# Patient Record
Sex: Female | Born: 1975 | Race: Black or African American | Hispanic: No | Marital: Married | Smoking: Never smoker
Health system: Southern US, Community
[De-identification: ages and names within clinical notes are randomized; demographics above are authoritative.]

## PROBLEM LIST (undated history)

## (undated) DIAGNOSIS — N946 Dysmenorrhea, unspecified: Secondary | ICD-10-CM

## (undated) DIAGNOSIS — E559 Vitamin D deficiency, unspecified: Secondary | ICD-10-CM

## (undated) DIAGNOSIS — O039 Complete or unspecified spontaneous abortion without complication: Secondary | ICD-10-CM

## (undated) DIAGNOSIS — N890 Mild vaginal dysplasia: Secondary | ICD-10-CM

## (undated) DIAGNOSIS — E785 Hyperlipidemia, unspecified: Secondary | ICD-10-CM

## (undated) DIAGNOSIS — B029 Zoster without complications: Secondary | ICD-10-CM

## (undated) DIAGNOSIS — D759 Disease of blood and blood-forming organs, unspecified: Secondary | ICD-10-CM

## (undated) DIAGNOSIS — B977 Papillomavirus as the cause of diseases classified elsewhere: Secondary | ICD-10-CM

## (undated) DIAGNOSIS — T8859XA Other complications of anesthesia, initial encounter: Secondary | ICD-10-CM

## (undated) DIAGNOSIS — T4145XA Adverse effect of unspecified anesthetic, initial encounter: Secondary | ICD-10-CM

## (undated) DIAGNOSIS — J45909 Unspecified asthma, uncomplicated: Secondary | ICD-10-CM

## (undated) DIAGNOSIS — D649 Anemia, unspecified: Secondary | ICD-10-CM

## (undated) HISTORY — DX: Anemia, unspecified: D64.9

## (undated) HISTORY — DX: Mild vaginal dysplasia: N89.0

## (undated) HISTORY — DX: Hyperlipidemia, unspecified: E78.5

## (undated) HISTORY — DX: Vitamin D deficiency, unspecified: E55.9

## (undated) HISTORY — DX: Zoster without complications: B02.9

## (undated) HISTORY — DX: Dysmenorrhea, unspecified: N94.6

## (undated) HISTORY — DX: Complete or unspecified spontaneous abortion without complication: O03.9

## (undated) HISTORY — PX: TONSILLECTOMY: SUR1361

## (undated) HISTORY — DX: Papillomavirus as the cause of diseases classified elsewhere: B97.7

---
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
## 1998-02-28 ENCOUNTER — Emergency Department (HOSPITAL_COMMUNITY): Admission: EM | Admit: 1998-02-28 | Discharge: 1998-02-28 | Payer: Self-pay | Admitting: Emergency Medicine

## 1998-05-28 ENCOUNTER — Emergency Department (HOSPITAL_COMMUNITY): Admission: EM | Admit: 1998-05-28 | Discharge: 1998-05-28 | Payer: Self-pay | Admitting: Emergency Medicine

## 1998-12-27 ENCOUNTER — Encounter: Admission: RE | Admit: 1998-12-27 | Discharge: 1999-01-24 | Payer: Self-pay | Admitting: *Deleted

## 1999-04-03 ENCOUNTER — Encounter: Payer: Self-pay | Admitting: Gynecology

## 1999-04-03 ENCOUNTER — Ambulatory Visit (HOSPITAL_COMMUNITY): Admission: RE | Admit: 1999-04-03 | Discharge: 1999-04-03 | Payer: Self-pay | Admitting: Gynecology

## 1999-06-28 ENCOUNTER — Ambulatory Visit (HOSPITAL_COMMUNITY): Admission: RE | Admit: 1999-06-28 | Discharge: 1999-06-28 | Payer: Self-pay | Admitting: Gynecology

## 1999-06-28 ENCOUNTER — Encounter: Payer: Self-pay | Admitting: Gynecology

## 2002-02-25 ENCOUNTER — Other Ambulatory Visit: Admission: RE | Admit: 2002-02-25 | Discharge: 2002-02-25 | Payer: Self-pay | Admitting: Gynecology

## 2002-09-13 ENCOUNTER — Other Ambulatory Visit: Admission: RE | Admit: 2002-09-13 | Discharge: 2002-09-13 | Payer: Self-pay | Admitting: Gynecology

## 2003-06-21 ENCOUNTER — Other Ambulatory Visit: Admission: RE | Admit: 2003-06-21 | Discharge: 2003-06-21 | Payer: Self-pay | Admitting: Gynecology

## 2005-01-16 ENCOUNTER — Other Ambulatory Visit: Admission: RE | Admit: 2005-01-16 | Discharge: 2005-01-16 | Payer: Self-pay | Admitting: Gynecology

## 2005-03-04 ENCOUNTER — Ambulatory Visit (HOSPITAL_COMMUNITY): Admission: RE | Admit: 2005-03-04 | Discharge: 2005-03-04 | Payer: Self-pay | Admitting: Gynecology

## 2006-01-29 ENCOUNTER — Other Ambulatory Visit: Admission: RE | Admit: 2006-01-29 | Discharge: 2006-01-29 | Payer: Self-pay | Admitting: Gynecology

## 2006-04-30 ENCOUNTER — Ambulatory Visit: Payer: Self-pay | Admitting: Gastroenterology

## 2006-05-22 ENCOUNTER — Ambulatory Visit (HOSPITAL_BASED_OUTPATIENT_CLINIC_OR_DEPARTMENT_OTHER): Admission: RE | Admit: 2006-05-22 | Discharge: 2006-05-22 | Payer: Self-pay | Admitting: Gynecology

## 2006-05-22 HISTORY — PX: OTHER SURGICAL HISTORY: SHX169

## 2006-06-16 ENCOUNTER — Ambulatory Visit: Payer: Self-pay | Admitting: Gastroenterology

## 2006-12-14 ENCOUNTER — Other Ambulatory Visit: Admission: RE | Admit: 2006-12-14 | Discharge: 2006-12-14 | Payer: Self-pay | Admitting: Gynecology

## 2007-07-29 HISTORY — PX: OTHER SURGICAL HISTORY: SHX169

## 2008-04-12 ENCOUNTER — Ambulatory Visit: Payer: Self-pay | Admitting: Gynecology

## 2008-07-25 ENCOUNTER — Emergency Department (HOSPITAL_COMMUNITY): Admission: EM | Admit: 2008-07-25 | Discharge: 2008-07-26 | Payer: Self-pay | Admitting: Emergency Medicine

## 2008-08-03 ENCOUNTER — Inpatient Hospital Stay (HOSPITAL_COMMUNITY): Admission: RE | Admit: 2008-08-03 | Discharge: 2008-08-05 | Payer: Self-pay | Admitting: Specialist

## 2009-05-09 ENCOUNTER — Emergency Department (HOSPITAL_COMMUNITY): Admission: EM | Admit: 2009-05-09 | Discharge: 2009-05-10 | Payer: Self-pay | Admitting: Emergency Medicine

## 2009-06-11 ENCOUNTER — Ambulatory Visit: Payer: Self-pay | Admitting: Gynecology

## 2009-06-11 ENCOUNTER — Encounter: Payer: Self-pay | Admitting: Gynecology

## 2009-06-11 ENCOUNTER — Other Ambulatory Visit: Admission: RE | Admit: 2009-06-11 | Discharge: 2009-06-11 | Payer: Self-pay | Admitting: Gynecology

## 2010-06-14 ENCOUNTER — Other Ambulatory Visit: Admission: RE | Admit: 2010-06-14 | Discharge: 2010-06-14 | Payer: Self-pay | Admitting: Gynecology

## 2010-06-14 ENCOUNTER — Ambulatory Visit: Payer: Self-pay | Admitting: Gynecology

## 2010-10-21 ENCOUNTER — Ambulatory Visit (INDEPENDENT_AMBULATORY_CARE_PROVIDER_SITE_OTHER): Payer: BC Managed Care – PPO | Admitting: Gynecology

## 2010-10-21 DIAGNOSIS — B373 Candidiasis of vulva and vagina: Secondary | ICD-10-CM

## 2010-10-21 DIAGNOSIS — N898 Other specified noninflammatory disorders of vagina: Secondary | ICD-10-CM

## 2010-10-31 LAB — URINALYSIS, ROUTINE W REFLEX MICROSCOPIC
Bilirubin Urine: NEGATIVE
Glucose, UA: NEGATIVE mg/dL
Hgb urine dipstick: NEGATIVE
Ketones, ur: NEGATIVE mg/dL
Nitrite: NEGATIVE
Protein, ur: NEGATIVE mg/dL
Specific Gravity, Urine: 1.009 (ref 1.005–1.030)
Urobilinogen, UA: 1 mg/dL (ref 0.0–1.0)
pH: 7 (ref 5.0–8.0)

## 2010-10-31 LAB — POCT I-STAT, CHEM 8
BUN: 6 mg/dL (ref 6–23)
Calcium, Ion: 1.13 mmol/L (ref 1.12–1.32)
Chloride: 103 mEq/L (ref 96–112)
Creatinine, Ser: 0.9 mg/dL (ref 0.4–1.2)
Glucose, Bld: 108 mg/dL — ABNORMAL HIGH (ref 70–99)
HCT: 37 % (ref 36.0–46.0)
Hemoglobin: 12.6 g/dL (ref 12.0–15.0)
Potassium: 3.4 mEq/L — ABNORMAL LOW (ref 3.5–5.1)
Sodium: 141 mEq/L (ref 135–145)
TCO2: 26 mmol/L (ref 0–100)

## 2010-10-31 LAB — DIFFERENTIAL
Basophils Absolute: 0 10*3/uL (ref 0.0–0.1)
Basophils Relative: 1 % (ref 0–1)
Eosinophils Absolute: 0 10*3/uL (ref 0.0–0.7)
Eosinophils Relative: 1 % (ref 0–5)
Lymphocytes Relative: 34 % (ref 12–46)
Lymphs Abs: 2 10*3/uL (ref 0.7–4.0)
Monocytes Absolute: 0.3 10*3/uL (ref 0.1–1.0)
Monocytes Relative: 6 % (ref 3–12)
Neutro Abs: 3.5 10*3/uL (ref 1.7–7.7)
Neutrophils Relative %: 59 % (ref 43–77)

## 2010-10-31 LAB — CBC
HCT: 34.7 % — ABNORMAL LOW (ref 36.0–46.0)
Hemoglobin: 12 g/dL (ref 12.0–15.0)
MCHC: 34.6 g/dL (ref 30.0–36.0)
MCV: 100.9 fL — ABNORMAL HIGH (ref 78.0–100.0)
Platelets: 228 10*3/uL (ref 150–400)
RBC: 3.44 MIL/uL — ABNORMAL LOW (ref 3.87–5.11)
RDW: 12.3 % (ref 11.5–15.5)
WBC: 5.9 10*3/uL (ref 4.0–10.5)

## 2010-10-31 LAB — POCT PREGNANCY, URINE: Preg Test, Ur: NEGATIVE

## 2010-11-11 LAB — BASIC METABOLIC PANEL
BUN: 6 mg/dL (ref 6–23)
CO2: 29 mEq/L (ref 19–32)
Calcium: 8.7 mg/dL (ref 8.4–10.5)
Chloride: 105 mEq/L (ref 96–112)
Creatinine, Ser: 0.66 mg/dL (ref 0.4–1.2)
GFR calc Af Amer: >60 mL/min (ref >60)
GFR calc non Af Amer: >60 mL/min (ref >60)
Glucose, Bld: 66 mg/dL — ABNORMAL LOW (ref 70–99)
Potassium: 3.1 mEq/L — ABNORMAL LOW (ref 3.5–5.1)
Sodium: 138 mEq/L (ref 135–145)

## 2010-11-11 LAB — CBC
HCT: 32.6 % — ABNORMAL LOW (ref 36.0–46.0)
Hemoglobin: 11 g/dL — ABNORMAL LOW (ref 12.0–15.0)
MCHC: 33.6 g/dL (ref 30.0–36.0)
MCV: 100.5 fL — ABNORMAL HIGH (ref 78.0–100.0)
Platelets: 185 10*3/uL (ref 150–400)
RBC: 3.25 MIL/uL — ABNORMAL LOW (ref 3.87–5.11)
RDW: 12.9 % (ref 11.5–15.5)
WBC: 5.4 10*3/uL (ref 4.0–10.5)

## 2010-11-11 LAB — PREGNANCY, URINE: Preg Test, Ur: NEGATIVE

## 2010-11-19 IMAGING — CR DG ANKLE COMPLETE 3+V*L*
3 series · 3 of 3 positions shown · non-contrast
Comparison: None

CLINICAL DATA: Fall, twisted left ankle roller skating

LEFT ANKLE COMPLETE - 3+ VIEW

[view not recorded (1 of 3)]
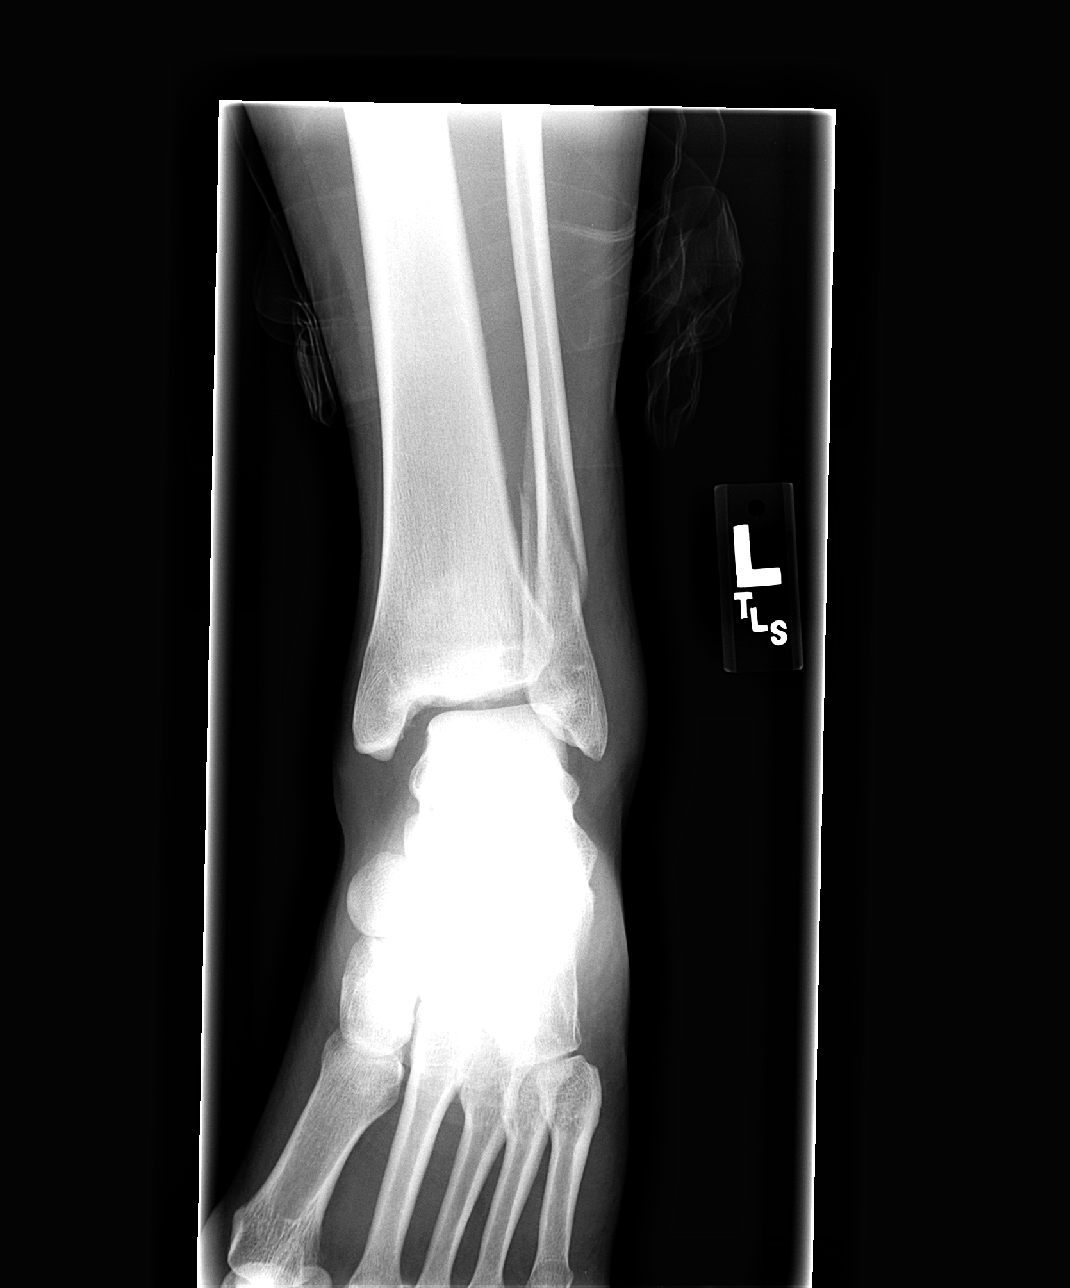

[view not recorded (2 of 3)]
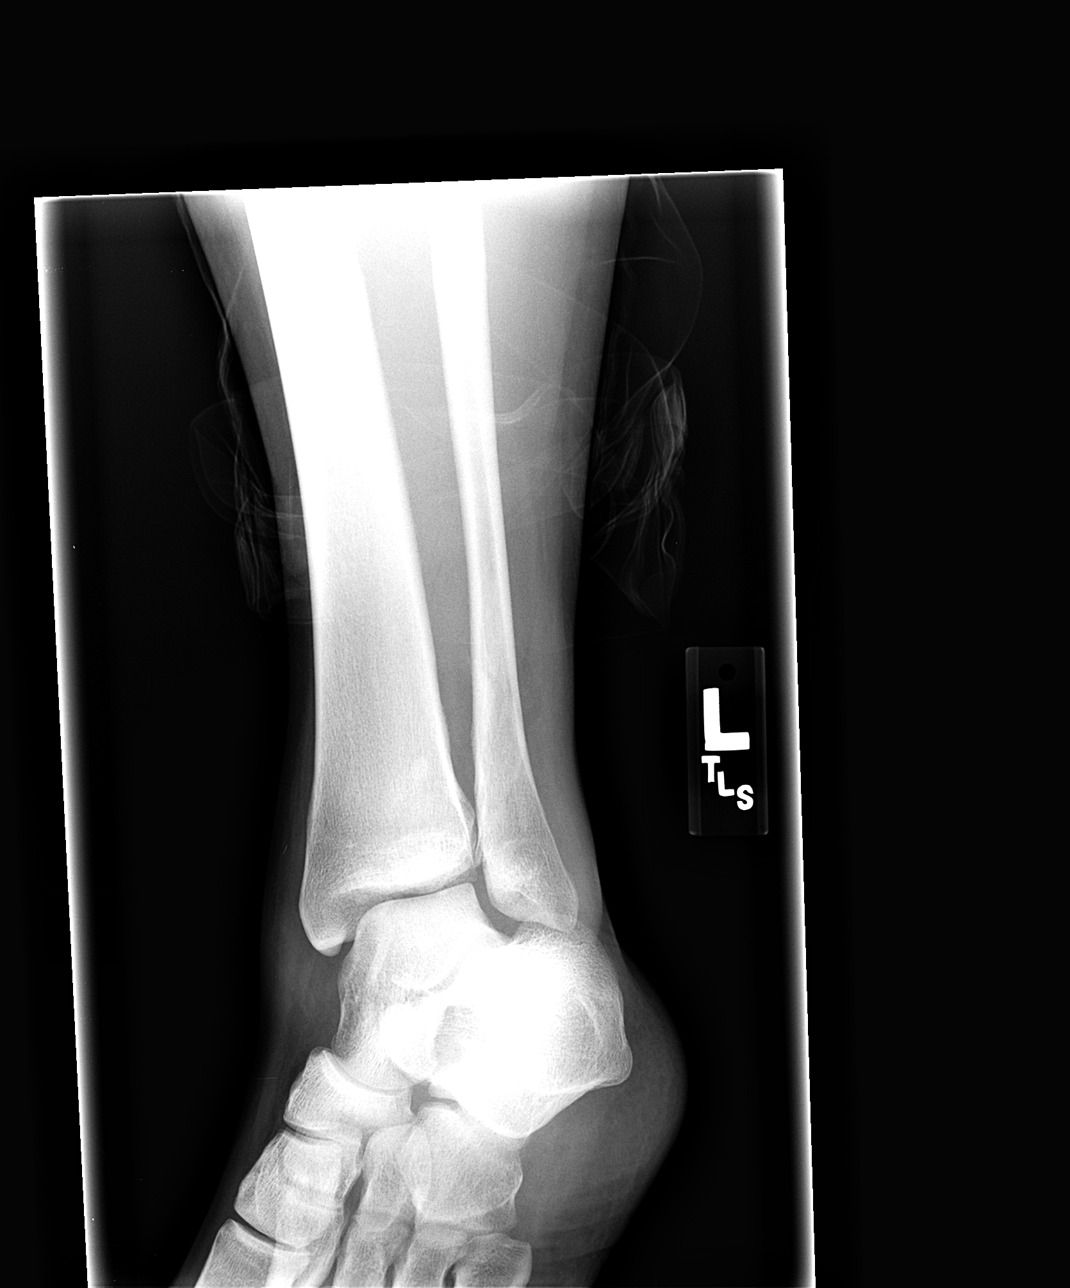

[view not recorded (3 of 3)]
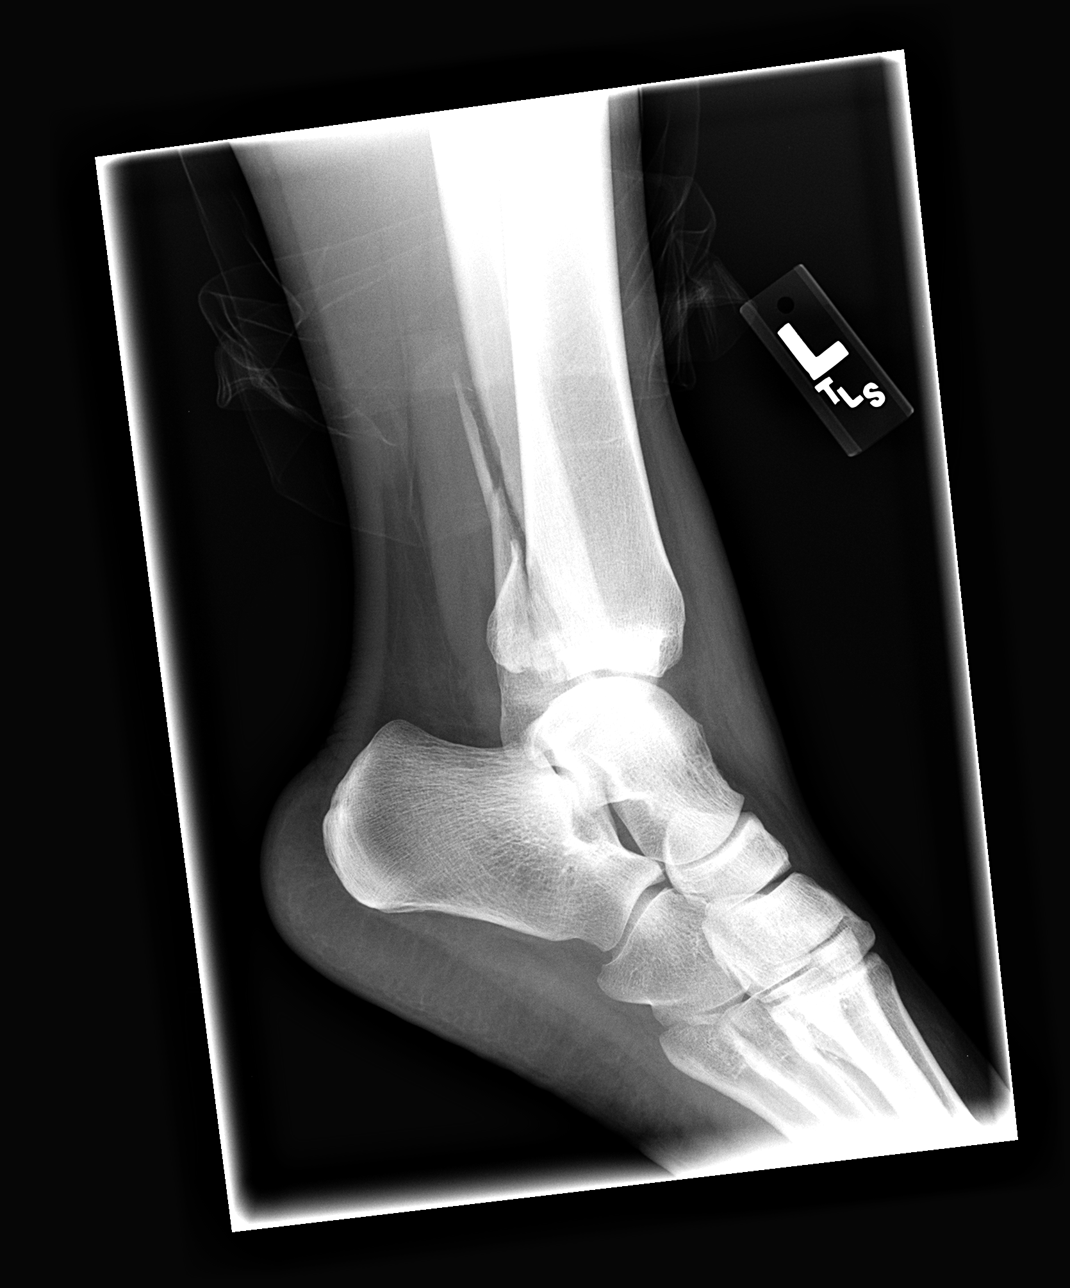

[3 of 3 positions shown; findings below may reference images not displayed]

FINDINGS: The ankle mortise is widened medially.  The talar dome
appears normal.  There is an oblique fracture through the distal
fibula.  There is a vertical fracture through the posterior
malleolus.
IMPRESSION: 1.  Oblique fracture through the distal fibula and vertical
fracture of the posterior malleolus of the tibia.
2.  Widened thinning of the medial ankle mortise suggests ligament
injury.

## 2010-11-29 ENCOUNTER — Ambulatory Visit (INDEPENDENT_AMBULATORY_CARE_PROVIDER_SITE_OTHER): Payer: BC Managed Care – PPO | Admitting: Gynecology

## 2010-11-29 DIAGNOSIS — Z30431 Encounter for routine checking of intrauterine contraceptive device: Secondary | ICD-10-CM

## 2010-11-29 DIAGNOSIS — N83209 Unspecified ovarian cyst, unspecified side: Secondary | ICD-10-CM

## 2010-11-29 DIAGNOSIS — Z803 Family history of malignant neoplasm of breast: Secondary | ICD-10-CM

## 2010-11-29 DIAGNOSIS — B373 Candidiasis of vulva and vagina: Secondary | ICD-10-CM

## 2010-11-29 DIAGNOSIS — N898 Other specified noninflammatory disorders of vagina: Secondary | ICD-10-CM

## 2010-12-10 NOTE — Op Note (Signed)
NAME:  Sabrina [name], Sabrina [name]                ACCOUNT NO.:  [id number]   MEDICAL RECORD NO.:  [id number]          PATIENT TYPE:  INP   LOCATION:  0008                         FACILITY[facility]  [facility]   PHYSICIAN:  Jene Every, M.D.    DATE OF BIRTH:  [birth year]   DATE OF PROCEDURE:  08/03/2008  DATE OF DISCHARGE:                               OPERATIVE REPORT   PREOPERATIVE DIAGNOSES:  1. Left bimalleolar ankle fracture.  2. Possible syndesmosis rupture.  3. Deltoid tear.   POSTOPERATIVE DIAGNOSES:  1. Left bimalleolar ankle fracture.  2. Possible syndesmosis rupture.  3. Deltoid tear.   PROCEDURES PERFORMED:  1. Open reduction and internal fixation, left fibula.  2  Open repair, deltoid ligament.  1. Stress radiographs.   BRIEF HISTORY AND INDICATION:  A 35 year old fractured the fibula,  posterior malleolus, wide in the mortise, indicated for repair.  Risks  and benefits discussed including infection, damage to vascular  structures, nonunion, malunion, need for revision or hardware removal,  etc.   TECHNIQUE:  Patient in supine position.  After induction of adequate  general anesthesia and 2 grams of Kefzol, the left lower extremity  prepped, draped and exsanguinated in the usual sterile fashion.  Exam  under anesthesia revealed an unstable joint and radiographs demonstrated  the posterior malleolar fragment approximately 20% reduced with  dorsiflexion.  There was medial joint space widening.  Incision was made  over the lateral aspect of the fibula.  Subcutaneous tissue was  dissected.  Electrocautery was utilized to achieve hemostasis.  Dissected sharply down to the outer aspect of the fibula, sparing a  branch of the peroneal nerve.  Noted was a long spiral fracture at the  level of the mortise, migrating proximal.  We irrigated and curetted the  fracture site and through multiple manipulations with plantar flexion  and internal rotation were able to reduce the fracture, held with  tenaculum, and 2 fully-threaded cortical lag screws were placed  obliquely across the fracture site after the appropriate overdrilling  with 3.5 and drilling with a 2.5 and measurement of the fully-threaded  cortical screw with excellent purchase and expression of the hematoma  following that.  Subsequent to that,  which was an anatomic reduction,  we contoured a third tubular plate to the outer aspect of the fibula,  securing it distally with 2 fully-threaded cancellous screws proximally  with 3 fully-threaded cortical screws after the appropriate drilling,  depth gauge measurement and tapping, insertion of the appropriate-length  screw with excellent purchase.  Following this, examination on the  __________ and stress radiographs indicated that the syndesmosis was  intact, the posterior malleolar fragment reduced by ligamentotaxis.  We  then made a small incision medially over the medial malleolus, bluntly  dissected that and sharply dissected down to the medial malleolus and to  its attachment and there was a moderate tear noted off the medial  malleolus.  This was debrided and we opened up into the joint, irrigated  it, and there was no soft tissue within the joint and therefore repaired  it with #1 interrupted Vicryl figure-of-eight sutures.  This was done as  there was persistence of this medial space.  Following this, an  examination and stress radiographs noted that the mortise was anatomic  and there was no disruption of the syndesmosis.  Attempt to manipulate  over the fibula revealed no instability of the fibula.  The wound was  copiously irrigated.  Laterally the subcu was reapproximated with 2-0  Vicryl simple sutures, reapproximating the wound, again sparing the  peroneal branches.  Skin was reapproximated staples medially, 2-0 Vicryl  and staples.  Tourniquet was deflated.  There was adequate  vascularization of the lower extremity appreciated.  Placed into a  sterile  dressing and a short-leg cast well-molded in neutral position,  slight supination, and post-casting radiographs demonstrated persistence  of the anatomic reduction.   The patient was then awoken without difficulty, extubated and  transported to recovery in satisfactory addition.   The patient tolerated the procedure well with no complications.  Tourniquet time was 1 hour 15 minutes.   ASSISTANT:  Roma Schanz, P.A.      Jene Every, M.D.  Electronically Signed     JB/MEDQ  D:  08/03/2008  T:  08/03/2008  Job:  [id number]

## 2010-12-13 NOTE — Op Note (Signed)
NAME:  Sabrina [name], Sabrina [name]                ACCOUNT NO.:  [id number]   MEDICAL RECORD NO.:  [id number]          PATIENT TYPE:  AMB   LOCATION:  NESC                         FACILITY:  [facility]   PHYSICIAN:  Juan H. Lily Peer, M.D.DATE OF BIRTH:  [birth year]   DATE OF PROCEDURE:  05/22/2006  DATE OF DISCHARGE:                                 OPERATIVE REPORT   INDICATIONS FOR OPERATION:  A 35 year old gravida 1, para 1, who had an  abnormal Pap smear consistent with atypical squamous cells of undetermined  significance.  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   POSTOPERATIVE DIAGNOSIS:  Vaginal intraepithelial neoplasia I.   PROCEDURE:  CO2 laser ablation of vaginal intraepithelial neoplasia I.   SURGEON:  Juan H. Lily Peer, M.D.   ANESTHESIA:  General endotracheal anesthesia.   DESCRIPTION OF OPERATION:  After the patient was adequately counseled, she  was taken to the operating room, where she underwent a successful general  endotracheal anesthesia.  She had received a gram of cefoxitin IV.  The  patient was placed in the high lithotomy position.  The external genitalia  was cleansed with Hibiclens solution.  The vagina and the cervix was then  bathed with acetic acid and the colposcope was brought into view.  The  entire external genitalia, vagina and cervix was reinspected again.  The  previous biopsy site at the left vaginal fornix where there was evidence  acetowhite areas was once again identified.  The CO2 laser was attached to  the colposcope and was set at 8 watts and a continuous mode and in a brush-  like technique, the entire left vaginal fornix was ablated to a depth of 2  mm in depth and beyond the areas of previous biopsies and beyond the site of  the  acetowhite lesions that were noted.  Upon completion, Silvadene cream  was applied to the area ablated and the speculum was removed.  The patient  was extubated, transferred to recovery room with stable vital signs.  Blood  loss was minimal and fluid resuscitation consisted of 900 mL of lactated  Ringer's.      Juan H. Lily Peer, M.D.  Electronically Signed     JHF/MEDQ  D:  05/22/2006  T:  05/23/2006  Job:  [id number]

## 2010-12-13 NOTE — Discharge Summary (Signed)
NAME:  Sabrina [name], Sabrina [name]                ACCOUNT NO.:  [id number]   MEDICAL RECORD NO.:  [id number]          PATIENT TYPE:  INP   LOCATION:  1621                         FACILITY:  [facility]   PHYSICIAN:  Jene Every, M.D.    DATE OF BIRTH:  04/24/1976   DATE OF ADMISSION:  08/03/2008  DATE OF DISCHARGE:  08/05/2008                               DISCHARGE SUMMARY   ADMITTING DIAGNOSIS:  Left bimalleolar ankle fracture.   DISCHARGE DIAGNOSIS:  1. Left bimalleolar ankle fracture, status post open reduction and      internal fixation of the left ankle.  2. Repair of syndesmosis, deltoid.   HISTORY:  [name] is a pleasant 35 year old female who slipped and  fell.  She was evaluated in the emergency room, referred to Dr. Shelle Iron  for definitive treatment.  She was diagnosed with a bimalleolar ankle  fracture, as well as a deltoid tear.  It was felt she would need  internal fixation of this fracture.  The risks and benefits of this were  discussed.  The patient does elect to proceed.   PROCEDURE:  The patient was taken to the OR on January 7, underwent open  reduction and internal fixation of left fibula, open repair of the  deltoid ligament, followed by stress radiographs.  Surgeon Dr. Jene Every, assistant Roma Schanz, P.A.-C.  Anesthesia general.  Complications none.   CONSULTANTS:  PT and OT.   PREOPERATIVE LABS:  Showed a white cell count of 5.4, hemoglobin 11.0  hematocrit 32.6.  Preop chemistries showed sodium 138, potassium 3.1,  glucose of 66, normal BUN and creatinine.  Urine pregnancy test was  negative.  Preop EKG showed normal sinus rhythm.  Preop chest x-ray  showed no evidence of acute cardiopulmonary disease.   HOSPITAL COURSE:  The patient was admitted, taken to the OR, underwent  the above-stated procedure without difficulty.  Cast was placed  intraoperatively.  She was then transferred to the PACU.  In PACU she  was complaining of some tingling in the toes,  but could feel light  touch.  She had good capillary refill, but scrubbing sensation is numb.  The patient did have increased pain overnight and cast was bivalved by  the ortho tech, but it was not split.  She continued to have significant  tingling laterally.  She was able to move her toes.  Thigh was soft,  nontender.  Capillary refill remained brisk.  We did reconsult ortho  tech to come spread the cast.  We encouraged elevation.  PCA was  continued.  We held discharge until the following day, when pain was  controlled.   Postop day #2 the patient was doing significantly better.  Pain was  controlled just on p.o. medication.  She noted increased sensation to  the lower extremity, again continuation of capillary refill.   DISPOSITION:  Patient is stable to be discharged home with all home  health needs met.  She is to follow with Dr. Shelle Iron in approximately 10  days for cast removal, suture removal.   WOUND CARE:  She is to keep  the cast clean and dry.  We discussed all  the cast care instructions with the patient as well as her family.   ACTIVITY:  She is to continue nonweightbearing.  Continue with  elevation.  She was encouraged to call if she notes any changes in  sensation or color to the lower extremity.   DISCHARGE MEDICATIONS:  1. Vitamin C 500 mg daily.  2. Dilaudid p.o. p.r.n. pain.  3. Robaxin p.r.n. spasm.  4. Aspirin 325 mg daily.  5. Multivitamin with iron.   DIET:  As tolerated.   CONDITION ON DISCHARGE:  Stable.   FINAL DIAGNOSIS:  Doing well, status post open reduction and internal  fixation of the left ankle.      Roma Schanz, P.A.      Jene Every, M.D.  Electronically Signed    CS/MEDQ  D:  09/06/2008  T:  09/06/2008  Job:  [id number]

## 2010-12-13 NOTE — Assessment & Plan Note (Signed)
[name]                           GASTROENTEROLOGY OFFICE NOTE   Sabrina, [name]                         MRN:          [id number]  DATE:04/30/2006                            DOB:          1976/06/01    PROBLEM:  Abdominal distention.   HISTORY OF PRESENT ILLNESS:  Sabrina [name] is a pleasant 35 year old African-  American female complaining of immediate postprandial abdominal bloating.  She has bloating with excess flatus. She may have cramps and nausea.  She  generally complains of constipation.  She clearly gets diarrhea and bloating  with milk products.   PAST MEDICAL HISTORY:  Pertinent for cervical dysplasia for which she is  scheduled for cryosurgery or laser surgery.   FAMILY HISTORY:  Noncontributory.   MEDICATIONS:  1. Depo-Provera.  2. Boric acid.   ALLERGIES:  She is allergic to SULFA.   SOCIAL HISTORY:  She neither smokes nor drinks.  She is divorced.  She works  as an Tourist information centre manager.   REVIEW OF SYSTEMS:  Positive for dysmenorrhea.   PHYSICAL EXAMINATION:  GENERAL APPEARANCE:  She is a healthy-appearing  female.  VITAL SIGNS:  Pulse 76, blood pressure 106/70. Weight 155.  HEENT: EOMI. PERRLA. Sclerae are anicteric.  Conjunctivae are pink.  NECK:  Supple without thyromegaly, adenopathy or carotid bruits.  CHEST:  Clear to auscultation and percussion without adventitious sounds.  CARDIAC:  Regular rhythm; normal S1 S2.  There are no murmurs, gallops or  rubs.  ABDOMEN:  Bowel sounds are normoactive.  Abdomen is soft, non-tender and non-  distended.  There are no abdominal masses, tenderness, splenic enlargement  or hepatomegaly.  EXTREMITIES:  Full range of motion.  No cyanosis, clubbing or edema.  RECTAL:  Deferred.   IMPRESSION:  1. Nonspecific dyspepsia.  Lactose intolerance may be contributing to      this.  2. Lactose intolerant.   RECOMMENDATION:  1. Lactaid supplementation.  2. Empiric therapy  with Zantac 150 mg b.i.d.  If not improved in the next      three to four weeks, I would consider upper endoscopy.       Barbette Hair. Arlyce Dice, MD,FACG      RDK/MedQ  DD:  04/30/2006  DT:  05/01/2006  Job #:  [id number]   cc:   Gaetano Hawthorne. Lily Peer, M.D.

## 2010-12-13 NOTE — Letter (Signed)
April 30, 2006     Sabrina [name]  [address] [place], [place] [address]   RE:  [name], [name]  MRN:  [id number]  /  DOB:  07/06/1976   Dear [name]:   It is my pleasure to have treated you recently as a new patient in my  office.  I appreciate your confidence and the opportunity to participate in  your care.   Since I do have a busy inpatient endoscopy schedule and office schedule, my  office hours vary weekly.  I am, however, available for emergency calls  every day through my office.  If I cannot promptly meet an urgent office  appointment, another one of our gastroenterologists will be able to assist  you.   My well-trained staff are prepared to help you at all times.  For  emergencies after office hours, a physician from our gastroenterology  section is always available through my 24-hour answering service.   While you are under my care, I encourage discussion of your questions and  concerns, and I will be happy to return your calls as soon as I am  available.   Once again, I welcome you as a new patient and I look forward to a happy and  healthy relationship.   Sincerely,     Barbette Hair. Arlyce Dice, MD,FACG   RDK/MedQ  DD:  04/30/2006 DT:  05/01/2006 Job #:  [id number]

## 2010-12-13 NOTE — Assessment & Plan Note (Signed)
[facility]                           GASTROENTEROLOGY OFFICE NOTE   Sabrina [name], Sabrina [name]                         MRN:          [id number]  DATE:06/16/2006                            DOB:          12-06-1975    SUBJECTIVE:  Sabrina [name] has returned for scheduled GI followup.  She is  taking lactated supplements and reports a complete resolution of her  abdominal distention.  She has no current GI complaints.   PHYSICAL EXAMINATION:  VITAL SIGNS:  Pulse 78, blood pressure 108/72, weight  158 pounds.   IMPRESSION:  Lactose intolerance.   RECOMMENDATIONS:  Continue on the current regimen.     Barbette Hair. Arlyce Dice, MD,FACG  Electronically Signed    RDK/MedQ  DD: 06/16/2006  DT: 06/16/2006  Job #: [id number]   cc:   Gaetano Hawthorne. Lily Peer, M.D.

## 2010-12-13 NOTE — H&P (Signed)
NAME:  Sabrina [name], Sabrina [name]                ACCOUNT NO.:  [id number]   MEDICAL RECORD NO.:  [id number]           PATIENT TYPE:   LOCATION:                                 FACILITY:   PHYSICIAN:  Juan H. Lily Peer, M.D.     DATE OF BIRTH:   DATE OF ADMISSION:  DATE OF DISCHARGE:                                HISTORY & PHYSICAL   DATE OF SERVICE:  Patient scheduled for surgery in [facility] on Friday, May 22, 2006 at 9 a.m.   CHIEF COMPLAINT:  Vaginal intraepithelial neoplasia I.   HISTORY:  The patient is a 35 year old gravida 1, para 1 who is using  condoms for contraception.  She was seen in the office on January 21, 2006 for  her annual gynecological examination, whereby she had a Pap smear which  demonstrated atypical squamous cells of indeterminate significance, positive  for HPV.  She subsequently underwent colposcopic evaluation and the  patient's left vaginal fornix demonstrated a low grade VAIN I.  The patient  will be taken to the operating room for laser ablation of VAIN I.  Of note,  the patient is now on Depo-Provera for contraception.   PAST MEDICAL HISTORY:  One prior cesarean section.   ALLERGIES:  THE PATIENT IS ALLERGIC TO SULFA.   MEDICATIONS:  She is taking iron supplementation, Depo-Provera for  contraception and calcium supplementation.   FAMILY HISTORY:  Insulin-dependent diabetes in her father, as well as  hypertension and cardiovascular disease, and a brother with cerebral palsy.   PHYSICAL EXAMINATION:  VITAL SIGNS:  The patient weighs 151 pounds.  Blood  pressure is 121/80.  HEENT:  Unremarkable.  NECK:  Supple; trachea midline.  No carotid bruits.  No thyromegaly.  LUNGS:  Clear to auscultation without rhonchi or wheezes.  HEART:  Regular rate and rhythm.  No murmurs or gallops.  BREASTS:  Exam not done.  ABDOMEN:  Soft and nontender without rebound or guarding.  PELVIC:  As described from the colposcopic evaluation, whereby she was  found  to have acetowhite lesion of the left vaginal fornix.   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  The patient is scheduled to undergo CO2 laser ablation  of vaginal dysplasia.  The risks, benefits, pros and cons were discussed.  All questions were answered.  Will follow accordingly.   PLAN:  The patient is scheduled for a CO2 laser ablation of her vaginal  fornix (left) for VAIN I on Friday, May 22, 2006 at 9 a.m. at [facility].      [place] H. Lily Peer, M.D.  Electronically Signed     JHF/MEDQ  D:  05/21/2006  T:  05/21/2006  Job:  [id number]

## 2011-04-02 DIAGNOSIS — B977 Papillomavirus as the cause of diseases classified elsewhere: Secondary | ICD-10-CM | POA: Insufficient documentation

## 2011-04-02 DIAGNOSIS — N890 Mild vaginal dysplasia: Secondary | ICD-10-CM | POA: Insufficient documentation

## 2011-04-02 DIAGNOSIS — N946 Dysmenorrhea, unspecified: Secondary | ICD-10-CM | POA: Insufficient documentation

## 2011-04-02 DIAGNOSIS — Z975 Presence of (intrauterine) contraceptive device: Secondary | ICD-10-CM | POA: Insufficient documentation

## 2011-04-10 ENCOUNTER — Other Ambulatory Visit: Payer: Self-pay | Admitting: Gynecology

## 2011-04-10 ENCOUNTER — Ambulatory Visit (INDEPENDENT_AMBULATORY_CARE_PROVIDER_SITE_OTHER): Payer: BC Managed Care – PPO | Admitting: Gynecology

## 2011-04-10 ENCOUNTER — Ambulatory Visit
Admission: RE | Admit: 2011-04-10 | Discharge: 2011-04-10 | Disposition: A | Discharge status: Home / Self Care | Payer: BC Managed Care – PPO | Source: Ambulatory Visit | Attending: Gynecology | Admitting: Gynecology

## 2011-04-10 DIAGNOSIS — N949 Unspecified condition associated with female genital organs and menstrual cycle: Secondary | ICD-10-CM

## 2011-04-10 DIAGNOSIS — N946 Dysmenorrhea, unspecified: Secondary | ICD-10-CM

## 2011-04-10 DIAGNOSIS — N8300 Follicular cyst of ovary, unspecified side: Secondary | ICD-10-CM

## 2011-04-10 DIAGNOSIS — M549 Dorsalgia, unspecified: Secondary | ICD-10-CM

## 2011-04-10 DIAGNOSIS — N83209 Unspecified ovarian cyst, unspecified side: Secondary | ICD-10-CM

## 2011-04-10 NOTE — Progress Notes (Signed)
Patient presented to the office today for an ultrasound followup. Review her record and indicated that which she was seen on may fourth of this year for vaginal discharge the IUD string was not visualized an ultrasound had been done. The IUD was seen in the endometrial cavity and. The left ovary was normal. The right ovary had an echo free thinwall avascular cysts measuring 40 x 31 mm no fluid was seen in the cul-de-sac. She returns 3 months later. The ultrasound today demonstrates a uterus that measures 11 x 5.3 x 3.7 cm endometrial stripe of 3.6 mm the IUD was seen in the endometrial cavity. The previous right ovarian cysts was no longer present the right ovary was normal she had 2 large follicles echo free on the left ovary one measuring 34 x 29 x 17 mm the other 38 x 37 x 41 mm negative color flow. Patient also had recently had a sprained ankle and with placing more weight a one leg and started to have some low back discomfort but denied any radiculopathy. She did state shortly after her last pregnancy she had sciatica which was transitory.  Examination: Patient had normal leg raises without any back pain. There was no sensory deficit or motor deficit of the lower extremities. On palpation near L3-L4 there were some paraspinous tenderness.  Assessment: #1. Functional follicle cysts as a result of Mirena IUD will followup with ultrasound and 4-6 months at time of her annual exam. #2 low back muscle spasm. We'll place patient on a low dose steroid of 10 mg over 12 day course along with a muscle relaxant Flexeril 10 mg one tablet one by mouth daily and Mobic 7.5 mg analgesic to take daily for the 10 days as well. If she continues to have symptoms after the above treatment she will contact the office and we'll refer her to orthopedist for further evaluation.

## 2011-04-30 ENCOUNTER — Telehealth: Payer: Self-pay | Admitting: *Deleted

## 2011-04-30 NOTE — Telephone Encounter (Signed)
Pt called wanting to know if she was tested for diabetes. Pt informed that she was tested for this.

## 2011-08-07 ENCOUNTER — Encounter: Payer: Self-pay | Admitting: Gynecology

## 2011-08-07 ENCOUNTER — Ambulatory Visit (INDEPENDENT_AMBULATORY_CARE_PROVIDER_SITE_OTHER): Payer: BC Managed Care – PPO | Admitting: Gynecology

## 2011-08-07 VITALS — BP 128/76

## 2011-08-07 DIAGNOSIS — N63 Unspecified lump in unspecified breast: Secondary | ICD-10-CM

## 2011-08-07 NOTE — Patient Instructions (Signed)
Sabrina [name], we'll call you to schedule a diagnostic mammogram. And I will see sooner for your annual exam which is overdue which I believe you going to schedule on her way out today.

## 2011-08-07 NOTE — Progress Notes (Signed)
Patient is a 36 year old gravida 1 para 1 who presented to the office today as a result of noticing a left breast lump this morning. She denies any nipple discharge no recent injury or trauma no discoloration. No first-line relatives with breast cancer except her grandmother. Patient with a Mirena IUD for contraception. Patient overdue for her annual exam which will be scheduling the next few weeks.  Breast examination:  Physical Exam  Pulmonary/Chest:      Patient will be referred to the radiologist for diagnostic mammogram and possible ultrasound of this region for further assessment with referral to general surgeon if not cystic and if it has not been aspirated by the radiologist. Of note contralateral breast was normal there was no supraclavicular or axillary lymphadenopathy on either breast.

## 2011-08-08 ENCOUNTER — Other Ambulatory Visit: Payer: Self-pay | Admitting: *Deleted

## 2011-08-08 ENCOUNTER — Telehealth: Payer: Self-pay | Admitting: *Deleted

## 2011-08-08 DIAGNOSIS — N63 Unspecified lump in unspecified breast: Secondary | ICD-10-CM

## 2011-08-08 NOTE — Telephone Encounter (Signed)
Message copied by Mckinley Jewel Ahmere Hemenway L on Fri Aug 08, 2011 11:00 AM ------      Message from: Ok Edwards      Created: Thu Aug 07, 2011  3:04 PM       Cecely Rengel, please schedule this patient for a diagnostic mammogram of the left breast see details on recent encounter from today. Thank you

## 2011-08-08 NOTE — Telephone Encounter (Signed)
Lm for patient to call.  Have appt set up at [facility] on 08/15/11 @9 :20

## 2011-08-08 NOTE — Telephone Encounter (Signed)
Patient informed appt information. 

## 2011-08-11 ENCOUNTER — Ambulatory Visit (INDEPENDENT_AMBULATORY_CARE_PROVIDER_SITE_OTHER): Payer: BC Managed Care – PPO | Admitting: Gynecology

## 2011-08-11 ENCOUNTER — Encounter: Payer: Self-pay | Admitting: Gynecology

## 2011-08-11 ENCOUNTER — Other Ambulatory Visit (HOSPITAL_COMMUNITY)
Admission: RE | Admit: 2011-08-11 | Discharge: 2011-08-11 | Disposition: A | Discharge status: Home / Self Care | Payer: BC Managed Care – PPO | Source: Ambulatory Visit | Attending: Gynecology | Admitting: Gynecology

## 2011-08-11 VITALS — BP 128/80 | Ht 63.0 in | Wt 159.0 lb

## 2011-08-11 DIAGNOSIS — Z01419 Encounter for gynecological examination (general) (routine) without abnormal findings: Secondary | ICD-10-CM | POA: Insufficient documentation

## 2011-08-11 DIAGNOSIS — Z833 Family history of diabetes mellitus: Secondary | ICD-10-CM | POA: Insufficient documentation

## 2011-08-11 DIAGNOSIS — R635 Abnormal weight gain: Secondary | ICD-10-CM

## 2011-08-11 LAB — CBC WITH DIFFERENTIAL/PLATELET
Basophils Absolute: 0 10*3/uL (ref 0.0–0.1)
Basophils Relative: 0 % (ref 0–1)
Eosinophils Absolute: 0 10*3/uL (ref 0.0–0.7)
Eosinophils Relative: 1 % (ref 0–5)
HCT: 35 % — ABNORMAL LOW (ref 36.0–46.0)
Hemoglobin: 11.7 g/dL — ABNORMAL LOW (ref 12.0–15.0)
Lymphocytes Relative: 35 % (ref 12–46)
Lymphs Abs: 1.7 10*3/uL (ref 0.7–4.0)
MCH: 32.7 pg (ref 26.0–34.0)
MCHC: 33.4 g/dL (ref 30.0–36.0)
MCV: 97.8 fL (ref 78.0–100.0)
Monocytes Absolute: 0.4 10*3/uL (ref 0.1–1.0)
Monocytes Relative: 9 % (ref 3–12)
Neutro Abs: 2.7 10*3/uL (ref 1.7–7.7)
Neutrophils Relative %: 55 % (ref 43–77)
Platelets: 226 10*3/uL (ref 150–400)
RBC: 3.58 MIL/uL — ABNORMAL LOW (ref 3.87–5.11)
RDW: 12.4 % (ref 11.5–15.5)
WBC: 4.8 10*3/uL (ref 4.0–10.5)

## 2011-08-11 LAB — CHOLESTEROL, TOTAL: Cholesterol: 174 mg/dL (ref 0–200)

## 2011-08-11 LAB — URINALYSIS W MICROSCOPIC + REFLEX CULTURE
Bilirubin Urine: NEGATIVE
Glucose, UA: NEGATIVE mg/dL
Hgb urine dipstick: NEGATIVE
Ketones, ur: NEGATIVE mg/dL
Leukocytes, UA: NEGATIVE
Nitrite: NEGATIVE
Protein, ur: NEGATIVE mg/dL
Specific Gravity, Urine: 1.005 (ref 1.005–1.030)
Urobilinogen, UA: 0.2 mg/dL (ref 0.0–1.0)
pH: 7 (ref 5.0–8.0)

## 2011-08-11 LAB — GLUCOSE, RANDOM: Glucose, Bld: 76 mg/dL (ref 70–99)

## 2011-08-11 LAB — TSH: TSH: 1.646 u[IU]/mL (ref 0.350–4.500)

## 2011-08-11 NOTE — Patient Instructions (Addendum)
Remember to followup for mammogram as had been scheduled.  Exercise to Lose Weight Exercise and a healthy diet may help you lose weight. Your doctor may suggest specific exercises. EXERCISE IDEAS AND TIPS  Choose low-cost things you enjoy doing, such as walking, bicycling, or exercising to workout videos.   Take stairs instead of the elevator.   Walk during your lunch break.   Park your car further away from work or school.   Go to a gym or an exercise class.   Start with 5 to 10 minutes of exercise each day. Build up to 30 minutes of exercise 4 to 6 days a week.   Wear shoes with good support and comfortable clothes.   Stretch before and after working out.   Work out until you breathe harder and your heart beats faster.   Drink extra water when you exercise.   Do not do so much that you hurt yourself, feel dizzy, or get very short of breath.  Exercises that burn about 150 calories:  Running 1  miles in 15 minutes.   Playing volleyball for 45 to 60 minutes.   Washing and waxing a car for 45 to 60 minutes.   Playing touch football for 45 minutes.   Walking 1  miles in 35 minutes.   Pushing a stroller 1  miles in 30 minutes.   Playing basketball for 30 minutes.   Raking leaves for 30 minutes.   Bicycling 5 miles in 30 minutes.   Walking 2 miles in 30 minutes.   Dancing for 30 minutes.   Shoveling snow for 15 minutes.   Swimming laps for 20 minutes.   Walking up stairs for 15 minutes.   Bicycling 4 miles in 15 minutes.   Gardening for 30 to 45 minutes.   Jumping rope for 15 minutes.   Washing windows or floors for 45 to 60 minutes.  Document Released: 08/16/2010 Document Revised: 03/26/2011 Document Reviewed: 08/16/2010 [facility] Patient Information 2012 [place], [place].                         Patient information: High cholesterol (The Basics)  What is cholesterol? - Cholesterol is a substance that is found in the blood. Everyone has some. It is  needed for good health. The problem is, people sometimes have too much cholesterol. Compared with people with normal cholesterol, people with high cholesterol have a higher risk of heart attacks, strokes, and other health problems. The higher your cholesterol, the higher your risk of these problems.  Are there different types of cholesterol? - Yes, there are a few different types. If you get a cholesterol test, you may hear your doctor or nurse talk about: Total cholesterol  LDL cholesterol - Some people call this the "bad" cholesterol. That's because having high LDL levels raises your risk of heart attacks, strokes, and other health problems.  HDL cholesterol - Some people call this the "good" cholesterol. That's because having high HDL levels lowers your risk of heart attacks, strokes, and other health problems.  Non-HDL cholesterol - Non-HDL cholesterol is your total cholesterol minus your HDL cholesterol.  Triglycerides - Triglycerides are not cholesterol. They are a type of fat. But they often get measured when cholesterol is measured. (Having high triglycerides also seems to increase the risk of heart attacks and strokes.)  What should my numbers be? - Ask your doctor or nurse what your numbers should be. Different people need different goals. (If you  live outside the [place], see (table 1)). In general, people who do not already have heart disease should aim for: Total cholesterol below 200  LDL cholesterol below 130 - or much lower, if they are at risk of heart attacks or strokes  HDL cholesterol above 60  Non-HDL cholesterol below 160 - or lower, if they are at risk of heart attacks or strokes  Triglycerides below 150 Keep in mind, though, that many people who cannot meet these goals still have a low risk of heart attacks and strokes. What should I do if my doctor tells me I have high cholesterol? - Ask your doctor what your overall risk of heart attacks and strokes is. High cholesterol,  by itself, is not always a reason to worry. Having high cholesterol is just one of many things that can increase your risk of heart attacks and strokes. Other factors that increase your risk include:  Cigarette smoking  High blood pressure  Having a parent, sister, or brother who got heart disease at a young age (Young, in this case, means younger than 20 for men and younger than 58 for women.)  Being a man (Women are at risk, too, but men have a higher risk.)  Older age  If you are at high risk of heart attacks and strokes, having high cholesterol is a problem. On the other hand, if you have are at low risk, having high cholesterol may not mean much. Should I take medicine to lower cholesterol? - Not everyone who has high cholesterol needs medicines. Your doctor or nurse will decide if you need them based on your age, family history, and other health concerns.  You should probably take a cholesterol-lowering medicine called a statin if you: Already had a heart attack or stroke  Have known heart disease  Have diabetes  Have a condition called peripheral artery disease, which makes it painful to walk, and happens when the arteries in your legs get clogged with fatty deposits  Have an abdominal aortic aneurysm, which is a widening of the main artery in the belly  Most people with any of the conditions listed above should take a statin no matter what their cholesterol level is. If your doctor or nurse puts you on a statin, stay on it. The medicine may not make you feel any different. But it can help prevent heart attacks, strokes, and death.  Can I lower my cholesterol without medicines? - Yes, you can lower your cholesterol some by:  Avoiding red meat, butter, fried foods, cheese, and other foods that have a lot of saturated fat  Losing weight (if you are overweight)  Being more active Even if these steps do little to change your cholesterol, they can improve your health in many  ways.   [facility] HMD3:50 PMTD@

## 2011-08-11 NOTE — Progress Notes (Signed)
Sabrina [name] 10-Aug-1975 [id number]   History:    36 y.o.  for annual exam with no complaints today. She is in the process of having a diagnostic mammogram and ultrasound next week as a result of a left upper quadrant breast mass at the 2:00 position 3 finger breast from the areolar region. 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 Followup Pap smears been normal. Patient with Mirena IUD placed in 2008 scheduled to return back in March to change. Cycles otherwise regular. Strong family history of diabetes. Also with weight gain in today's visit compared to previous visit where she was weighing 147 is up to 159.  Past medical history,surgical history, family history and social history were all reviewed and documented in the EPIC chart.  Gynecologic History No LMP recorded. Patient is not currently having periods (Reason: IUD). Contraception: IUD Last Pap: 2011. Results were: normal Last mammogram: No prior study. Results were: No prior study  Obstetric History OB History    Grav Para Term Preterm Abortions TAB SAB Ect Mult Living   1 1 1       1      # Outc Date GA Lbr Len/2nd Wgt Sex Del Anes PTL Lv   1 TRM     F CS  No Yes       ROS:  Was performed and pertinent positives and negatives are included in the history.  Exam: chaperone present  BP 128/80  Ht 5\' 3"  (1.6 m)  Wt 159 lb (72.122 kg)  BMI 28.17 kg/m2  Body mass index is 28.17 kg/(m^2).  General appearance : Well developed well nourished female. No acute distress HEENT: Neck supple, trachea midline, no carotid bruits, no thyroidmegaly Lungs: Clear to auscultation, no rhonchi or wheezes, or rib retractions  Heart: Regular rate and rhythm, no murmurs or gallops Breast:Examined in sitting and supine position were symmetrical in appearance, left breast 2:00 position 3 finger breast from the areolar region a 1/2-2 cm mobile mass was noted. Contralateral breast unremarkable  no skin retraction, no nipple  inversion, no nipple discharge, no skin discoloration, no axillary or supraclavicular lymphadenopathy Abdomen: no palpable masses or tenderness, no rebound or guarding Extremities: no edema or skin discoloration or tenderness  Pelvic:  Bartholin, Urethra, Skene Glands: Within normal limits             Vagina: No gross lesions or discharge  Cervix: No gross lesions or discharge IUD string seen  Uterus  anteverted, normal size, shape and consistency, non-tender and mobile  Adnexa  Without masses or tenderness  Anus and perineum  normal   Rectovaginal  normal sphincter tone without palpated masses or tenderness             Hemoccult not done     Assessment/Plan:  36 y.o. female for annual exam declined flu vaccine. The normal exam. The following lab work were drawn today: Blood sugar, CBC, TSH, cholesterol, urinalysis and Pap smear. Patient was given literature formation on exercise and diet. She will return back to the office in March to change her IUD. She will followup in the next week for her previously scheduled diagnostic mammogram and ultrasound of the left breast. We'll results and manage accordingly.    Ok Edwards MD, 4:13 PM 08/11/2011

## 2011-08-12 ENCOUNTER — Other Ambulatory Visit: Payer: Self-pay

## 2011-08-12 DIAGNOSIS — D649 Anemia, unspecified: Secondary | ICD-10-CM

## 2011-08-15 ENCOUNTER — Other Ambulatory Visit: Payer: BC Managed Care – PPO

## 2011-08-22 ENCOUNTER — Ambulatory Visit
Admission: RE | Admit: 2011-08-22 | Discharge: 2011-08-22 | Disposition: A | Discharge status: Home / Self Care | Payer: BC Managed Care – PPO | Source: Ambulatory Visit | Attending: Gynecology | Admitting: Gynecology

## 2011-08-22 DIAGNOSIS — N63 Unspecified lump in unspecified breast: Secondary | ICD-10-CM

## 2011-09-16 ENCOUNTER — Telehealth: Payer: Self-pay | Admitting: *Deleted

## 2011-09-16 NOTE — Telephone Encounter (Signed)
Lm for patient to call.  Benefits run on Mirena IUD. Patient has $30 copay

## 2011-09-18 ENCOUNTER — Other Ambulatory Visit: Payer: Self-pay | Admitting: *Deleted

## 2011-09-18 DIAGNOSIS — Z3049 Encounter for surveillance of other contraceptives: Secondary | ICD-10-CM

## 2011-09-18 MED ORDER — LEVONORGESTREL 20 MCG/24HR IU IUD: INTRAUTERINE_SYSTEM | Freq: Once | INTRAUTERINE | Status: DC

## 2011-09-18 NOTE — Telephone Encounter (Signed)
Patient informed will get done 10/03/11.

## 2011-10-03 ENCOUNTER — Encounter: Payer: Self-pay | Admitting: Gynecology

## 2011-10-03 ENCOUNTER — Ambulatory Visit (INDEPENDENT_AMBULATORY_CARE_PROVIDER_SITE_OTHER): Payer: BC Managed Care – PPO | Admitting: Gynecology

## 2011-10-03 VITALS — BP 128/84

## 2011-10-03 DIAGNOSIS — Z30432 Encounter for removal of intrauterine contraceptive device: Secondary | ICD-10-CM

## 2011-10-03 DIAGNOSIS — N83209 Unspecified ovarian cyst, unspecified side: Secondary | ICD-10-CM

## 2011-10-03 DIAGNOSIS — Z3049 Encounter for surveillance of other contraceptives: Secondary | ICD-10-CM

## 2011-10-03 DIAGNOSIS — R102 Pelvic and perineal pain: Secondary | ICD-10-CM

## 2011-10-03 DIAGNOSIS — Z30430 Encounter for insertion of intrauterine contraceptive device: Secondary | ICD-10-CM

## 2011-10-03 DIAGNOSIS — N949 Unspecified condition associated with female genital organs and menstrual cycle: Secondary | ICD-10-CM

## 2011-10-03 NOTE — Patient Instructions (Signed)
Intrauterine Device Insertion Most often, an intrauterine device (IUD) is inserted into the uterus to prevent pregnancy. There are 2 types of IUDs available:  Copper IUD. This type of IUD creates an environment that is not favorable to sperm survival. The mechanism of action of the copper IUD is not known for certain. It can stay in place for 10 years.   Hormone IUD. This type of IUD contains the hormone progestin (synthetic progesterone). The progestin thickens the cervical mucus and prevents sperm from entering the uterus, and it also thins the uterine lining. There is no evidence that the hormone IUD prevents implantation. The hormone IUD can stay in place for up to 5 years.  An IUD is the most cost-effective birth control if left in place for the full duration. It may be removed at any time. LET YOUR CAREGIVER KNOW ABOUT:  Sensitivity to metals.   Medicines taken including herbs, eyedrops, over-the-counter medicines, and creams.   Use of steroids (by mouth or creams).   Previous problems with anesthetics or numbing medicine.   Previous gynecological surgery.   History of blood clots or clotting disorders.   Possibility of pregnancy.   Menstrual irregularities.   Concerns regarding unusual vaginal discharge or odors.   Previous experience with an IUD.   Other health problems.  RISKS AND COMPLICATIONS  Accidental puncture (perforation) of the uterus.   Accidental placement of the IUD either in the muscle layer of the uterus (myometrium) or outside the uterus. If this happen, the IUD can be found essentially floating around the bowels. When this happens, the IUD must be taken out surgically.   The IUD may fall out of the uterus (expulsion). This is more common in women who have recently had a child.    Pregnancy in the fallopian tube (ectopic).  BEFORE THE PROCEDURE  Schedule the IUD insertion for when you will have your menstrual period or right after, to make sure you  are not pregnant. Placement of the IUD is better tolerated shortly after a menstrual cycle.   You may need to take tests or be examined to make sure you are not pregnant.   You may be required to take a pregnancy test.   You may be required to get checked for sexually transmitted infections (STIs) prior to placement. Placing an IUD in someone who has an infection can make an infection worse.   You may be given a pain reliever to take 1 or 2 hours before the procedure.   An exam will be performed to determine the size and position of your uterus.   Ask your caregiver about changing or stopping your regular medicines.  PROCEDURE   A tool (speculum) is placed in the vagina. This allows your caregiver to see the lower part of the uterus (cervix).   The cervix is prepped with a medicine that lowers the risk of infection.   You may be given a medicine to numb each side of the cervix (intracervical or paracervical block). This is used to block and control any discomfort with insertion.   A tool (uterine sound) is inserted into the uterus to determine the length of the uterine cavity and the direction the uterus may be tilted.   A slim instrument (IUD inserter) is inserted through the cervical canal and into your uterus.   The IUD is placed in the uterine cavity and the insertion device is removed.   The nylon string that is attached to the IUD, and used for   eventual IUD removal, is trimmed. It is trimmed so that it lays high in the vagina, just outside the cervix.  AFTER THE PROCEDURE  You may have bleeding after the procedure. This is normal. It varies from light spotting for a few days to menstrual-like bleeding.   You may have mild cramping.   Practice checking the string coming out of the cervix to make sure the IUD remains in the uterus. If you cannot feel the string, you should schedule a "string check" with your caregiver.   If you had a hormone IUD inserted, expect that your  period may be lighter or nonexistent within a year's time (though this is not always the case). There may be delayed fertility with the hormone IUD as a result of its progesterone effect. When you are ready to become pregnant, it is suggested to have the IUD removed up to 1 year in advance.   Yearly exams are advised.  Document Released: 03/12/2011 Document Revised: 07/03/2011 Document Reviewed: 03/12/2011 [employer] Patient Information 2012 [employer]. 

## 2011-10-03 NOTE — Progress Notes (Signed)
Patient 36 year old who presented to the office today to remove her expired Mirena IUD and to be replaced with a new one. Patient has done well with a Mirena IUD without any problems. Patient's recent Pap smear January 2013 normal. Patient has had some vague right lower quadrant discomfort at times. She has had history of ovarian cyst in the past. When she returned back in the office in one month for followup after the IUD is placed today we'll do an ultrasound to assess her adnexa as well as to confirm appropriate position of the IUD.  Patient's fully aware that this form of contraception is 99% effective and is good for 5 years. The risks benefits and pros and cons were discussed as well as potential risk during insertion of the IUD. All questions were answered consent form was signed.  Exam: Abdomen: Soft nontender no rebound or guarding Pelvic: Bartholin urethra Skene was within normal limits Vagina: No lesions or discharge Cervix: IUD string seen Uterus: Anteverted normal size shape and consistency Adnexa: No palpable masses or tenderness Rectal: Not examined  The cervix was cleansed with Betadine solution a single-tooth tenaculum was placed on the anterior cervical lip a [place] clamp was utilized to grasp the IUD string and to retrieve it. The IUD was shown to the patient and discarded. The uterus was then sounded to 7-1/2 cm and a new Mirena IUD was placed sterilely without any complication and the string was trimmed. Patient was given Aleve for discomfort and she'll return back in one month for followup as well as ultrasound. Instructions provided.

## 2011-10-29 ENCOUNTER — Ambulatory Visit (INDEPENDENT_AMBULATORY_CARE_PROVIDER_SITE_OTHER): Payer: BC Managed Care – PPO | Admitting: Gynecology

## 2011-10-29 ENCOUNTER — Ambulatory Visit (INDEPENDENT_AMBULATORY_CARE_PROVIDER_SITE_OTHER): Payer: BC Managed Care – PPO

## 2011-10-29 ENCOUNTER — Ambulatory Visit: Payer: BC Managed Care – PPO | Admitting: Gynecology

## 2011-10-29 DIAGNOSIS — R102 Pelvic and perineal pain: Secondary | ICD-10-CM

## 2011-10-29 DIAGNOSIS — Z975 Presence of (intrauterine) contraceptive device: Secondary | ICD-10-CM

## 2011-10-29 DIAGNOSIS — N83209 Unspecified ovarian cyst, unspecified side: Secondary | ICD-10-CM

## 2011-10-29 DIAGNOSIS — Z30431 Encounter for routine checking of intrauterine contraceptive device: Secondary | ICD-10-CM

## 2011-10-29 DIAGNOSIS — N949 Unspecified condition associated with female genital organs and menstrual cycle: Secondary | ICD-10-CM

## 2011-10-29 NOTE — Progress Notes (Signed)
The patient presented to the office today for an ultrasound and to confirm that the IUD is in the appropriate position. Patient with past history of ovarian cyst. She has no complaints from the IUD. She states that after we changed the IUD she has not had any further pelvic discomfort.  Ultrasound report: The uterus measured 10.1 x 6.2 x 4.6 cm with an endometrial stripe of 5.2 mm right ovary had a thinwall echo-free cyst measuring 30 x 40 x 20 mm avascular left R. was normal previous has not seen no fluid in the cul-de-sac. IUD was seen in normal position.  The above findings discussed with the patient and she will return back in 6 months for followup ultrasound to ascertain resolution of this thinwall echo-free cyst.

## 2012-02-25 ENCOUNTER — Encounter: Payer: Self-pay | Admitting: *Deleted

## 2012-02-25 NOTE — Progress Notes (Signed)
Patient ID: Sabrina [name], female   DOB: [birth year], 36 y.o.   MRN: [id number] Pt left message requesting rx for motion sickness. Left message for pt to call.

## 2012-04-14 ENCOUNTER — Other Ambulatory Visit: Payer: Self-pay | Admitting: *Deleted

## 2012-04-14 ENCOUNTER — Other Ambulatory Visit: Payer: Self-pay | Admitting: Gynecology

## 2012-04-14 DIAGNOSIS — N63 Unspecified lump in unspecified breast: Secondary | ICD-10-CM

## 2012-04-21 ENCOUNTER — Other Ambulatory Visit: Payer: BC Managed Care – PPO

## 2012-04-28 ENCOUNTER — Ambulatory Visit
Admission: RE | Admit: 2012-04-28 | Discharge: 2012-04-28 | Disposition: A | Discharge status: Home / Self Care | Payer: BC Managed Care – PPO | Source: Ambulatory Visit | Attending: Gynecology | Admitting: Gynecology

## 2012-04-28 DIAGNOSIS — N63 Unspecified lump in unspecified breast: Secondary | ICD-10-CM

## 2012-05-18 ENCOUNTER — Telehealth: Payer: Self-pay | Admitting: *Deleted
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
## 2012-06-04 ENCOUNTER — Encounter (HOSPITAL_COMMUNITY): Payer: Self-pay | Admitting: Emergency Medicine

## 2012-06-04 ENCOUNTER — Emergency Department (HOSPITAL_COMMUNITY)
Admission: EM | Admit: 2012-06-04 | Discharge: 2012-06-04 | Disposition: A | Discharge status: Home / Self Care | Payer: BC Managed Care – PPO | Attending: Emergency Medicine | Admitting: Emergency Medicine

## 2012-06-04 DIAGNOSIS — M7918 Myalgia, other site: Secondary | ICD-10-CM

## 2012-06-04 DIAGNOSIS — Z79899 Other long term (current) drug therapy: Secondary | ICD-10-CM | POA: Insufficient documentation

## 2012-06-04 DIAGNOSIS — Z86718 Personal history of other venous thrombosis and embolism: Secondary | ICD-10-CM | POA: Insufficient documentation

## 2012-06-04 DIAGNOSIS — Z87412 Personal history of vulvar dysplasia: Secondary | ICD-10-CM | POA: Insufficient documentation

## 2012-06-04 DIAGNOSIS — R209 Unspecified disturbances of skin sensation: Secondary | ICD-10-CM | POA: Insufficient documentation

## 2012-06-04 DIAGNOSIS — Z8742 Personal history of other diseases of the female genital tract: Secondary | ICD-10-CM | POA: Insufficient documentation

## 2012-06-04 DIAGNOSIS — M549 Dorsalgia, unspecified: Secondary | ICD-10-CM | POA: Insufficient documentation

## 2012-06-04 DIAGNOSIS — Z309 Encounter for contraceptive management, unspecified: Secondary | ICD-10-CM | POA: Insufficient documentation

## 2012-06-04 MED ORDER — NAPROXEN 500 MG PO TABS: 500.0000 mg | ORAL_TABLET | Freq: Two times a day (BID) | ORAL | Status: DC

## 2012-06-04 MED ORDER — METHOCARBAMOL 500 MG PO TABS: 1000.0000 mg | ORAL_TABLET | Freq: Four times a day (QID) | ORAL | Status: DC

## 2012-06-04 NOTE — ED Provider Notes (Signed)
History     CSN: [id number]  Arrival date & time 06/04/12  1224   First MD Initiated Contact with Patient 06/04/12 1241      Chief Complaint  Patient presents with  . Back Pain    Pain in upper back, radiating down l/arm    (Consider location/radiation/quality/duration/timing/severity/associated sxs/prior treatment) HPI Comments: Patient presents with onset of L middle back pain starting this AM, worse with movement. Described as 'tight'. Pain persisted after taking aleve and patient states she became nervous. She experienced numbness and tingling in L hand and fingers turned blue and cold for short period of time. These symptoms are all completely resolved now. No weakness. No h/o heart problems. No stroke symptoms or vision change. The onset of this condition was acute. The course is improving. Aggravating factors: movement. Alleviating factors: none. No h/o Raynaud.    Patient is a 36 y.o. female presenting with back pain. The history is provided by the patient.  Back Pain  Associated symptoms include numbness. Pertinent negatives include no fever, no dysuria, no pelvic pain and no weakness.    Past Medical History  Diagnosis Date  . Dysmenorrhea   . VAIN I (vaginal intraepithelial neoplasia grade I)   . High risk HPV infection   . IUD (intrauterine device) in place 11/23/2006    MIRENA IUD INSERTED 11/23/06    Past Surgical History  Procedure Date  . Cesarean section   . C02 laser of vain i 05/22/2006  . Tonsillectomy 1996  . Left axilla fracture     OPEN REDUCTION W/PLATES AND SCREWS    Family History  Problem Relation Age of Onset  . Diabetes Father     INSULIN DEPENDENT  . Hypertension Father   . Heart disease Father   . Cerebral palsy Brother   . Breast cancer Maternal Grandmother     GRANDMOTHER/NO INDICATED IF PAT. OR MATERNAL  . Asthma Mother     History  Substance Use Topics  . Smoking status: Never Smoker   . Smokeless tobacco: Never Used  .  Alcohol Use: 0.5 oz/week    1 drink(s) per week     Comment: occ...    OB History    Grav Para Term Preterm Abortions TAB SAB Ect Mult Living   1 1 1       1       Review of Systems  Constitutional: Negative for fever and unexpected weight change.  HENT: Negative for neck pain.   Eyes: Negative for visual disturbance.  Gastrointestinal: Negative for constipation.  Genitourinary: Negative for dysuria, hematuria, flank pain, vaginal bleeding, vaginal discharge and pelvic pain.  Musculoskeletal: Positive for back pain.  Neurological: Positive for numbness. Negative for weakness.    Allergies  Hydrocodone; Oxycodone; Prednisone; Sulfa antibiotics; and Tramadol  Home Medications   Current Outpatient Rx  Name  Route  Sig  Dispense  Refill  . ENSURE PLUS PO LIQD   Oral   Take 237 mLs by mouth daily.         Marland Kitchen NAPROXEN SODIUM 220 MG PO TABS   Oral   Take 220 mg by mouth 2 (two) times daily as needed. For pain.         Marland Kitchen LEVONORGESTREL 20 MCG/24HR IU IUD   Intrauterine   1 each by Intrauterine route once. INSERTED 11/23/2006.            BP 134/74  Pulse 77  Temp 98.2 F (36.8 C) (Oral)  Resp 16  Wt 155 lb (70.308 kg)  SpO2 99%  Physical Exam  Nursing note and vitals reviewed. Constitutional: She appears well-developed and well-nourished.  HENT:  Head: Normocephalic and atraumatic.  Eyes: Conjunctivae normal are normal.  Neck: Normal range of motion. Neck supple.  Cardiovascular: Normal rate and regular rhythm.   Pulses:      Radial pulses are 2+ on the right side, and 2+ on the left side.  Pulmonary/Chest: Effort normal.  Abdominal: Soft. There is no tenderness. There is no CVA tenderness.  Musculoskeletal: Normal range of motion. She exhibits tenderness. She exhibits no edema.       No step-off noted with palpation of spine.   Neurological: She is alert. She has normal strength and normal reflexes. No sensory deficit.       5/5 strength in entire lower  extremities bilaterally. No sensation deficit.   Skin: Skin is warm and dry. No rash noted.  Psychiatric: She has a normal mood and affect.    ED Course  Procedures (including critical care time)  Labs Reviewed - No data to display No results found.   1. Musculoskeletal pain     1:30 PM Patient seen and examined. Work-up initiated. Medications ordered.   Vital signs reviewed and are as follows: Filed Vitals:   06/04/12 1254  BP: 134/74  Pulse: 77  Temp: 98.2 F (36.8 C)  Resp: 16   Patient counseled on proper use of muscle relaxant medication.  They were told not to drink alcohol, drive any vehicle, or do any dangerous activities while taking this medication.  Patient verbalized understanding.   MDM  H/o DVT -- doubt, symptoms would be explained by arterial occlusion but patient is currently asymptomatic and she has symmetric bilateral 2+ radial pulses. No apparent risk factors for developing embolism. Symptoms resolved at current time. Patient given strict return instructions if fingers/arm turns blue and she seems reliable. No further testing indicated at time of exam.   Do not suspect PE. No pleuritic component, no tachycardia or SOB. Pain is likely MSK as pain is worse with movement. She moves without difficulty.        Renne Crigler, [place] 06/04/12 1525

## 2012-06-04 NOTE — ED Provider Notes (Signed)
Medical screening examination/treatment/procedure(s) were performed by non-physician practitioner and as supervising physician I was immediately available for consultation/collaboration.   Richardean Canal, MD 06/04/12 [phone]

## 2012-06-04 NOTE — ED Notes (Signed)
Pain in upper back started 3 hrs ago, radiated to l/arm. Tingling in l/arm x 2 hrs. Denies chest pain and Shortness of breath

## 2012-07-14 ENCOUNTER — Encounter: Payer: Self-pay | Admitting: Gynecology

## 2012-07-14 ENCOUNTER — Ambulatory Visit (INDEPENDENT_AMBULATORY_CARE_PROVIDER_SITE_OTHER): Payer: BC Managed Care – PPO | Admitting: Gynecology

## 2012-07-14 VITALS — BP 128/80

## 2012-07-14 DIAGNOSIS — Z8742 Personal history of other diseases of the female genital tract: Secondary | ICD-10-CM

## 2012-07-14 DIAGNOSIS — L293 Anogenital pruritus, unspecified: Secondary | ICD-10-CM

## 2012-07-14 DIAGNOSIS — N898 Other specified noninflammatory disorders of vagina: Secondary | ICD-10-CM

## 2012-07-14 LAB — WET PREP FOR TRICH, YEAST, CLUE
Trich, Wet Prep: NONE SEEN
Yeast Wet Prep HPF POC: NONE SEEN

## 2012-07-14 MED ORDER — METRONIDAZOLE 500 MG PO TABS: 500.0000 mg | ORAL_TABLET | Freq: Two times a day (BID) | ORAL | Status: DC

## 2012-07-14 NOTE — Patient Instructions (Addendum)
Bacterial Vaginosis Bacterial vaginosis (BV) is a vaginal infection where the normal balance of bacteria in the vagina is disrupted. The normal balance is then replaced by an overgrowth of certain bacteria. There are several different kinds of bacteria that can cause BV. BV is the most common vaginal infection in women of childbearing age. CAUSES   The cause of BV is not fully understood. BV develops when there is an increase or imbalance of harmful bacteria.  Some activities or behaviors can upset the normal balance of bacteria in the vagina and put women at increased risk including:  Having a new sex partner or multiple sex partners.  Douching.  Using an intrauterine device (IUD) for contraception.  It is not clear what role sexual activity plays in the development of BV. However, women that have never had sexual intercourse are rarely infected with BV. Women do not get BV from toilet seats, bedding, swimming pools or from touching objects around them.  SYMPTOMS   Grey vaginal discharge.  A fish-like odor with discharge, especially after sexual intercourse.  Itching or burning of the vagina and vulva.  Burning or pain with urination.  Some women have no signs or symptoms at all. DIAGNOSIS  Your caregiver must examine the vagina for signs of BV. Your caregiver will perform lab tests and look at the sample of vaginal fluid through a microscope. They will look for bacteria and abnormal cells (clue cells), a pH test higher than 4.5, and a positive amine test all associated with BV.  RISKS AND COMPLICATIONS   Pelvic inflammatory disease (PID).  Infections following gynecology surgery.  Developing HIV.  Developing herpes virus. TREATMENT  Sometimes BV will clear up without treatment. However, all women with symptoms of BV should be treated to avoid complications, especially if gynecology surgery is planned. Female partners generally do not need to be treated. However, BV may spread  between female sex partners so treatment is helpful in preventing a recurrence of BV.   BV may be treated with antibiotics. The antibiotics come in either pill or vaginal cream forms. Either can be used with nonpregnant or pregnant women, but the recommended dosages differ. These antibiotics are not harmful to the baby.  BV can recur after treatment. If this happens, a second round of antibiotics will often be prescribed.  Treatment is important for pregnant women. If not treated, BV can cause a premature delivery, especially for a pregnant woman who had a premature birth in the past. All pregnant women who have symptoms of BV should be checked and treated.  For chronic reoccurrence of BV, treatment with a type of prescribed gel vaginally twice a week is helpful. HOME CARE INSTRUCTIONS   Finish all medication as directed by your caregiver.  Do not have sex until treatment is completed.  Tell your sexual partner that you have a vaginal infection. They should see their caregiver and be treated if they have problems, such as a mild rash or itching.  Practice safe sex. Use condoms. Only have 1 sex partner. PREVENTION  Basic prevention steps can help reduce the risk of upsetting the natural balance of bacteria in the vagina and developing BV:  Do not have sexual intercourse (be abstinent).  Do not douche.  Use all of the medicine prescribed for treatment of BV, even if the signs and symptoms go away.  Tell your sex partner if you have BV. That way, they can be treated, if needed, to prevent reoccurrence. SEEK MEDICAL CARE IF:     Your symptoms are not improving after 3 days of treatment.  You have increased discharge, pain, or fever. MAKE SURE YOU:   Understand these instructions.  Will watch your condition.  Will get help right away if you are not doing well or get worse. FOR MORE INFORMATION  Division of STD Prevention (DSTDP), Centers for Disease Control and Prevention:  www.cdc.gov/std [employer] Association (ASHA): www.ashastd.org  Document Released: 07/14/2005 Document Revised: 10/06/2011 Document Reviewed: 01/04/2009 [employer] Patient Information 2013 [employer].  

## 2012-07-14 NOTE — Progress Notes (Signed)
Patient presented to the office today complaining of a foul odor from her vagina has some slight discomfort and pruritus but no true discharge reported. Patient has a Mirena IUD. Patient monogamous relationship.  Exam: Bartholin urethra Skene was within normal limits Vagina: Fishy-like discharge was noted mixed with some menstrual blood Cervix: IUD string seen Uterus: Not examined Adnexa: Not examined rectal exam: Not done  Wet prep demonstrated positive amine,  moderate clue cells, 2 numerous to count bacteria and a few white blood cells.  Assessment/plan: Clinical evidence of bacterial vaginosis. Patient will be placed on Flagyl 500 mg one by mouth twice a day for 5 days. Patient scheduled for annual exam next month. Patient will also be having an ultrasound due to the fact that early part of this year she was found to have a right ovarian cyst which was a thin-walled, echo-free and measured 30 x 40 x 20 mm and was avascular.

## 2012-08-11 ENCOUNTER — Ambulatory Visit (INDEPENDENT_AMBULATORY_CARE_PROVIDER_SITE_OTHER): Payer: BC Managed Care – PPO | Admitting: Gynecology

## 2012-08-11 ENCOUNTER — Encounter: Payer: BC Managed Care – PPO | Admitting: Gynecology

## 2012-08-11 ENCOUNTER — Ambulatory Visit (INDEPENDENT_AMBULATORY_CARE_PROVIDER_SITE_OTHER): Payer: BC Managed Care – PPO

## 2012-08-11 ENCOUNTER — Encounter: Payer: Self-pay | Admitting: Gynecology

## 2012-08-11 VITALS — BP 128/86 | Ht 63.0 in | Wt 159.0 lb

## 2012-08-11 DIAGNOSIS — Z113 Encounter for screening for infections with a predominantly sexual mode of transmission: Secondary | ICD-10-CM

## 2012-08-11 DIAGNOSIS — Z30431 Encounter for routine checking of intrauterine contraceptive device: Secondary | ICD-10-CM

## 2012-08-11 DIAGNOSIS — Z833 Family history of diabetes mellitus: Secondary | ICD-10-CM

## 2012-08-11 DIAGNOSIS — Z01419 Encounter for gynecological examination (general) (routine) without abnormal findings: Secondary | ICD-10-CM

## 2012-08-11 DIAGNOSIS — Z8742 Personal history of other diseases of the female genital tract: Secondary | ICD-10-CM

## 2012-08-11 DIAGNOSIS — J029 Acute pharyngitis, unspecified: Secondary | ICD-10-CM

## 2012-08-11 DIAGNOSIS — N8300 Follicular cyst of ovary, unspecified side: Secondary | ICD-10-CM

## 2012-08-11 DIAGNOSIS — N898 Other specified noninflammatory disorders of vagina: Secondary | ICD-10-CM

## 2012-08-11 LAB — CBC WITH DIFFERENTIAL/PLATELET
Basophils Absolute: 0 10*3/uL (ref 0.0–0.1)
Basophils Relative: 0 % (ref 0–1)
Eosinophils Absolute: 0.1 10*3/uL (ref 0.0–0.7)
Eosinophils Relative: 1 % (ref 0–5)
HCT: 36.5 % (ref 36.0–46.0)
Hemoglobin: 12.3 g/dL (ref 12.0–15.0)
Lymphocytes Relative: 38 % (ref 12–46)
Lymphs Abs: 1.8 10*3/uL (ref 0.7–4.0)
MCH: 32.4 pg (ref 26.0–34.0)
MCHC: 33.7 g/dL (ref 30.0–36.0)
MCV: 96.1 fL (ref 78.0–100.0)
Monocytes Absolute: 0.4 10*3/uL (ref 0.1–1.0)
Monocytes Relative: 9 % (ref 3–12)
Neutro Abs: 2.5 10*3/uL (ref 1.7–7.7)
Neutrophils Relative %: 52 % (ref 43–77)
Platelets: 253 10*3/uL (ref 150–400)
RBC: 3.8 MIL/uL — ABNORMAL LOW (ref 3.87–5.11)
RDW: 12.8 % (ref 11.5–15.5)
WBC: 4.8 10*3/uL (ref 4.0–10.5)

## 2012-08-11 LAB — WET PREP FOR TRICH, YEAST, CLUE
Trich, Wet Prep: NONE SEEN
Yeast Wet Prep HPF POC: NONE SEEN

## 2012-08-11 LAB — HEMOGLOBIN A1C
Hgb A1c MFr Bld: 5.5 % (ref <5.7)
Mean Plasma Glucose: 111 mg/dL (ref <117)

## 2012-08-11 MED ORDER — CLINDAMYCIN PHOSPHATE 2 % VA CREA: 1.0000 | TOPICAL_CREAM | Freq: Every day | VAGINAL | Status: DC

## 2012-08-11 MED ORDER — NYSTATIN 100000 UNIT/ML MT SUSP: 500000.0000 [IU] | Freq: Four times a day (QID) | OROMUCOSAL | Status: DC

## 2012-08-11 NOTE — Progress Notes (Addendum)
Sabrina [name] [birth year] [id number]   History:    37 y.o.  for annual gyn exam who stated that the previous week she had an upper respiratory tract infection which has resolved but she does have sore throat since then. Patient does her past history tonsillectomy. She denies any fever chills nausea or vomiting. Patient had a Mirena IUD replaced on March 2013 and has done well. Her flu she is up-to-date. Patient's last mammogram with the following result:  Findings: The breast tissue is heterogeneously dense. Scattered  partially obscured partially circumscribed fluctuating masses in  the right breast are consistent with cysts. The cluster of  calcifications in the right upper outer quadrant posteriorly are  amorphous and shape on the CC view. There is a suggestion of some  layering on the ML view. No new findings are noted.  Mammographic images were processed with CAD.  IMPRESSION:  Stable probably benign calcifications in the right breast.  RECOMMENDATION:  Bilateral diagnostic mammogram in 6 months  Patient had an office ultrasound 10/29/2011 whereby was noted that she had a right ovarian echo-free cyst that measured 3 x 4 x 2 cm and was totally avascular. Followup ultrasound today demonstrated complete resolution of ovarian cyst both ovaries were normal. IUD was in the proper position.  Past medical history,surgical history, family history and social history were all reviewed and documented in the EPIC chart.  Gynecologic History No LMP recorded. Patient is not currently having periods (Reason: IUD). Contraception: IUD Last Pap: 2013. Results were: normal Last mammogram: As per above. Results were: As per above  Obstetric History OB History    Grav Para Term Preterm Abortions TAB SAB Ect Mult Living   1 1 1       1      # Outc Date GA Lbr Len/2nd Wgt Sex Del Anes PTL Lv   1 TRM     F CS  No Yes       ROS: A ROS was performed and pertinent positives and negatives are included in  the history.  GENERAL: No fevers or chills. HEENT: No change in vision, no earache, sore throat NECK: No pain or stiffness. CARDIOVASCULAR: No chest pain or pressure. No palpitations. PULMONARY: No shortness of breath, cough or wheeze. GASTROINTESTINAL: No abdominal pain, nausea, vomiting or diarrhea, melena or bright red blood per rectum. GENITOURINARY: No urinary frequency, urgency, hesitancy or dysuria. MUSCULOSKELETAL: No joint or muscle pain, no back pain, no recent trauma. DERMATOLOGIC: No rash, no itching, no lesions. ENDOCRINE: No polyuria, polydipsia, no heat or cold intolerance. No recent change in weight. HEMATOLOGICAL: No anemia or easy bruising or bleeding. NEUROLOGIC: No headache, seizures, numbness, tingling or weakness. PSYCHIATRIC: No depression, no loss of interest in normal activity or change in sleep pattern.     Exam: chaperone present  BP 128/86  Ht 5\' 3"  (1.6 m)  Wt 159 lb (72.122 kg)  BMI 28.17 kg/m2  Body mass index is 28.17 kg/(m^2).  General appearance : Well developed well nourished female. No acute distress HEENT: Neck supple, trachea midline, no carotid bruits, no thyroidmegaly, the base of the tongue with evidence of thrush and absence of tonsils. Lungs: Clear to auscultation, no rhonchi or wheezes, or rib retractions  Heart: Regular rate and rhythm, no murmurs or gallops Breast:Examined in sitting and supine position were symmetrical in appearance, no palpable masses or tenderness,  no skin retraction, no nipple inversion, no nipple discharge, no skin discoloration, no axillary or supraclavicular lymphadenopathy Abdomen: no palpable masses  or tenderness, no rebound or guarding Extremities: no edema or skin discoloration or tenderness  Pelvic:  Bartholin, Urethra, Skene Glands: Within normal limits             Vagina: No gross lesions or discharge, IUD string seen brownish frothy discharge was noted.  Cervix: No gross lesions or discharge  Uterus  anteverted,  normal size, shape and consistency, non-tender and mobile  Adnexa  Without masses or tenderness  Anus and perineum  normal   Rectovaginal  normal sphincter tone without palpated masses or tenderness             Hemoccult not done   Wet prep positive Amine, many clue cells and few white blood cells along with tumors to count bacteria  Assessment/Plan:  37 y.o. female for annual exam with clinical evidence of bacterial vaginosis. GC and Chlamydia culture was obtained results pending at time of this dictation. Patient with prescribe Cleocin vaginal cream to apply each bedtime for 5 days. For her oral thrush and she will be placed on oral Mycostatin 100,000 units per mL suspension to take 5 cc 4 times a day for the next 2 or 3 days. The following labs were ordered: Hemoglobin A1c, CBC, cholesterol, TSH, as well as urinalysis. New Pap smear screening guidelines discussed. No Pap smear done today. Patient was reminded to do her monthly self breast examination and to followup with the recommended followup mammogram in 6 months.    Ok Edwards MD, 7:52 PM 08/11/2012

## 2012-08-11 NOTE — Patient Instructions (Addendum)
Tetanus, Diphtheria, Pertussis (Tdap) Vaccine What You Need to Know WHY GET VACCINATED? Tetanus, diphtheria and pertussis can be very serious diseases, even for adolescents and adults. Tdap vaccine can protect us from these diseases. TETANUS (Lockjaw) causes painful muscle tightening and stiffness, usually all over the body.  It can lead to tightening of muscles in the head and neck so you can't open your mouth, swallow, or sometimes even breathe. Tetanus kills about 1 out of 5 people who are infected. DIPHTHERIA can cause a thick coating to form in the back of the throat.  It can lead to breathing problems, paralysis, heart failure, and death. PERTUSSIS (Whooping Cough) causes severe coughing spells, which can cause difficulty breathing, vomiting and disturbed sleep.  It can also lead to weight loss, incontinence, and rib fractures. Up to 2 in 100 adolescents and 5 in 100 adults with pertussis are hospitalized or have complications, which could include pneumonia and death. These diseases are caused by bacteria. Diphtheria and pertussis are spread from person to person through coughing or sneezing. Tetanus enters the body through cuts, scratches, or wounds. Before vaccines, the [place] saw as many as 200,000 cases a year of diphtheria and pertussis, and hundreds of cases of tetanus. Since vaccination began, tetanus and diphtheria have dropped by about 99% and pertussis by about 80%. TDAP VACCINE Tdap vaccine can protect adolescents and adults from tetanus, diphtheria, and pertussis. One dose of Tdap is routinely given at age 11 or 12. People who did not get Tdap at that age should get it as soon as possible. Tdap is especially important for health care professionals and anyone having close contact with a baby younger than 12 months. Pregnant women should get a dose of Tdap during every pregnancy, to protect the newborn from pertussis. Infants are most at risk for severe, life-threatening  complications from pertussis. A similar vaccine, called Td, protects from tetanus and diphtheria, but not pertussis. A Td booster should be given every 10 years. Tdap may be given as one of these boosters if you have not already gotten a dose. Tdap may also be given after a severe cut or burn to prevent tetanus infection. Your doctor can give you more information. Tdap may safely be given at the same time as other vaccines. SOME PEOPLE SHOULD NOT GET THIS VACCINE  If you ever had a life-threatening allergic reaction after a dose of any tetanus, diphtheria, or pertussis containing vaccine, OR if you have a severe allergy to any part of this vaccine, you should not get Tdap. Tell your doctor if you have any severe allergies.  If you had a coma, or long or multiple seizures within 7 days after a childhood dose of DTP or DTaP, you should not get Tdap, unless a cause other than the vaccine was found. You can still get Td.  Talk to your doctor if you:  have epilepsy or another nervous system problem,  had severe pain or swelling after any vaccine containing diphtheria, tetanus or pertussis,  ever had Guillain-Barr Syndrome (GBS),  aren't feeling well on the day the shot is scheduled. RISKS OF A VACCINE REACTION With any medicine, including vaccines, there is a chance of side effects. These are usually mild and go away on their own, but serious reactions are also possible. Brief fainting spells can follow a vaccination, leading to injuries from falling. Sitting or lying down for about 15 minutes can help prevent these. Tell your doctor if you feel dizzy or light-headed, or   have vision changes or ringing in the ears. Mild problems following Tdap (Did not interfere with activities)  Pain where the shot was given (about 3 in 4 adolescents or 2 in 3 adults)  Redness or swelling where the shot was given (about 1 person in 5)  Mild fever of at least 100.28F (up to about 1 in 25 adolescents or 1 in  100 adults)  Headache (about 3 or 4 people in 10)  Tiredness (about 1 person in 3 or 4)  Nausea, vomiting, diarrhea, stomach ache (up to 1 in 4 adolescents or 1 in 10 adults)  Chills, body aches, sore joints, rash, swollen glands (uncommon) Moderate problems following Tdap (Interfered with activities, but did not require medical attention)  Pain where the shot was given (about 1 in 5 adolescents or 1 in 100 adults)  Redness or swelling where the shot was given (up to about 1 in 16 adolescents or 1 in 25 adults)  Fever over 102F (about 1 in 100 adolescents or 1 in 250 adults)  Headache (about 3 in 20 adolescents or 1 in 10 adults)  Nausea, vomiting, diarrhea, stomach ache (up to 1 or 3 people in 100)  Swelling of the entire arm where the shot was given (up to about 3 in 100). Severe problems following Tdap (Unable to perform usual activities, required medical attention)  Swelling, severe pain, bleeding and redness in the arm where the shot was given (rare). A severe allergic reaction could occur after any vaccine (estimated less than 1 in a million doses). WHAT IF THERE IS A SERIOUS REACTION? What should I look for?  Look for anything that concerns you, such as signs of a severe allergic reaction, very high fever, or behavior changes. Signs of a severe allergic reaction can include hives, swelling of the face and throat, difficulty breathing, a fast heartbeat, dizziness, and weakness. These would start a few minutes to a few hours after the vaccination. What should I do?  If you think it is a severe allergic reaction or other emergency that can't wait, call 9-1-1 or get the person to the nearest hospital. Otherwise, call your doctor.  Afterward, the reaction should be reported to the "Vaccine Adverse Event Reporting System" (VAERS). Your doctor might file this report, or you can do it yourself through the VAERS web site at www.vaers.LAgents.no, or by calling 1-7541863269. VAERS is  only for reporting reactions. They do not give medical advice.  THE NATIONAL VACCINE INJURY COMPENSATION PROGRAM The National Vaccine Injury Compensation Program (VICP) is a federal program that was created to compensate people who may have been injured by certain vaccines. Persons who believe they may have been injured by a vaccine can learn about the program and about filing a claim by calling 1-340-565-9954 or visiting the VICP website at SpiritualWord.at. HOW CAN I LEARN MORE?  Ask your doctor.  Call your local or state health department.  Contact the Centers for Disease Control and Prevention (CDC):  Call 401-752-6628 or visit CDC's website at PicCapture.uy CDC Tdap Vaccine VIS (12/04/11) Document Released: 01/13/2012 Document Reviewed: 01/13/2012 [facility] Patient Information 2013 [place], [place]. Thrush, Adult  Ginette Pitman is a yeast infection that develops in the mouth and throat and on the tongue. The medical term for this is oropharyngeal candidiasis, or OPC. Ginette Pitman is most common in older adults, but it can occur at any age. Ginette Pitman occurs when a yeast called candida grows out of control. Candida normally is present in small amounts in the mouth  and on other mucous membranes. However, under certain circumstances, candida can grow rapidly, causing thrush. Ginette Pitman can be a recurring problem for people who have chronic illnesses or who take medications that limit the body's ability to fight infection (weakened immune system). Since these people have difficulty fighting infections, the fungus that causes thrush can spread throughout the body. This can cause life-threatening blood or organ infections. CAUSES  Candida, the yeast that causes thrush, is normally present in small amounts in the mouth and on other mucous membranes. It usually causes no harm. However, when conditions are present that allow the yeast to grow uncontrolled, it invades surrounding tissues and becomes  an infection. Ginette Pitman is most commonly caused by the yeast Candida albicans. Less often, other forms of candida can lead to thrush. There are many types of bacteria in your mouth that normally control the growth of candida. Sometimes a new type of bacteria gets into your mouth and disrupts the balance of the germs already there. This can allow candida to overgrow. Other factors that increase your risk of developing thrush include:  An impaired ability to fight infection (weakened immune system). A normal immune system is usually strong enough to prevent candida from overgrowing.  Older adults are more likely to develop thrush because they may have weaker immune systems.  People with human immunodeficiency virus (HIV) infection have a high likelihood of developing thrush. About 90% of people with HIV develop thrush at some point during the course of their disease.  People with diabetes are more likely to get thrush because high blood sugar levels promote overgrowth of the candida fungus.  A dry mouth (xerostomia). Dry mouth can result from overuse of mouthwashes or from certain conditions such as Sjgren's syndrome.  Pregnancy. Hormone changes during pregnancy can lead to thrush by altering the balance of bacteria in the mouth.  Poor dental care, especially in people who have false teeth.  The use of antibiotic medications. This may lead to thrush by changing the balance of bacteria in the mouth. SYMPTOMS  Thrush can be a mild infection that causes no symptoms. If symptoms develop, they may include the following:  A burning feeling in the mouth and throat. This can occur at the start of a thrush infection.  White patches that adhere to the mouth and tongue. The tissue around the patches may be red, raw, and painful. If rubbed (during tooth brushing, for example), the patches and the tissue of the mouth may bleed easily.  A bad taste in the mouth or difficulty tasting foods.  Cottony feeling  in the mouth.  Sometimes pain during eating and swallowing. DIAGNOSIS  Your caregiver can usually diagnose thrush by exam. In addition to looking in your mouth, your caregiver will ask you questions about your health. TREATMENT  Medications that help prevent the growth of fungi (antifungals) are the standard treatment for thrush. These medications are either applied directly to the affected area (topical) or swallowed (oral). Mild thrush In adults, mild cases of thrush may clear up with simple treatment that can be done at home. This treatment usually involves using an antifungal mouth rinse or lozenges. Treatment usually lasts about 14 days. Moderate to severe thrush  More severe thrush infections that have spread to the esophagus are treated with an oral antifungal medication. A topical antifungal medication may also be used.  For some severe infections, a treatment period longer than 14 days may be needed.  Oral antifungal medications are almost never used during  pregnancy because the fetus may be harmed. However, if a pregnant woman has a rare, severe thrush infection that has spread to her blood, oral antifungal medications may be used. In this case, the risk of harm to the mother and fetus from the severe thrush infection may be greater than the risk posed by the use of antifungal medications. Persistent or recurrent thrush Persistent (does not go away) or recurrent (keeps coming back) cases of thrush may:  Need to be treated twice as long as the symptoms last.  Require treatment with both oral and topical antifungal medications.  People with weakened immune systems can take an antifungal medication on a continuous basis to prevent thrush infections. It is important to treat conditions that make you more likely to get thrush, such as diabetes, human immunodeficiency virus (HIV), or cancer.  HOME CARE INSTRUCTIONS   If you are breast-feeding, you should clean your nipples with an  antifungal medication, such as nystatin (Mycostatin). Dry your nipples after breast-feeding. Applying lanolin-containing body lotion may help relieve nipple soreness.  If you wear dentures and get thrush, remove dentures before going to bed, brush them vigorously, and soak in a solution of chlorhexidine gluconate or a product such as Polident or Efferdent.  Eating plain, unflavored yogurt that contains live cultures (check the label) can also help cure thrush. Yogurt helps healthy bacteria grow in the mouth. These bacteria stop the growth of the yeast that causes thrush.  Adults can treat thrush at home with gentian violet (1%), a dye that kills bacteria and fungi. It is available without a prescription. If there is no known cause for the infection or if gentian violet does not cure the thrush, you need to see your caregiver. Comfort measures Measures can be taken to reduce the discomfort of thrush:  Drink cold liquids such as water or iced tea. Eat flavored ice treats or frozen juices.  Eat foods that are easy to swallow such as gelatin, ice cream, or custard.  If the patches are painful, try drinking from a straw.  Rinse your mouth several times a day with a warm saltwater rinse. You can make the saltwater mixture with 1 tsp (5 g) of salt in 8 fl oz (0.2 L) of warm water. PROGNOSIS   Most cases of thrush are mild and clear up with the use of an antifungal mouth rinse or lozenges. Very mild cases of thrush may clear up without medical treatment. It usually takes about 14 days of treatment with an oral antifungal medication to cure more severe thrush infections. In some cases, thrush may last several weeks even with treatment.  If thrush goes untreated and does not go away by itself, it can spread to other parts of the body.  Thrush can spread to the throat, the vagina, or the skin. It rarely spreads to other organs of the body. Ginette Pitman is more likely to recur (come back) in:  People who  use inhaled corticosteroids to treat asthma.  People who take antibiotic medications for a long time.  People who have false teeth.  People who have a weakened immune system. RISKS AND COMPLICATIONS Complications related to thrush are rare in healthy people. There are several factors that can increase your risk of developing thrush. Age Older adults, especially those who have serious health problems, are more likely to develop thrush because their immune systems are likely to be weaker. Behavior  The yeast that causes thrush can be spread by oral sex.  Heavy smoking  can lower the body's ability to fight off infections. This makes thrush more likely to develop. Other conditions  False teeth (dentures), braces, or a retainer that irritates the mouth make it hard to keep the mouth clean. An unclean mouth is more likely to develop thrush than a clean mouth.  People with a weakened immune system, such as those who have diabetes or human immunodeficiency virus (HIV) or who are undergoing chemotherapy, have an increased risk for developing thrush. Medications Some medications can allow the fungus that causes thrush to grow uncontrolled. Common ones are:  Antibiotics, especially those that kill a wide range of organisms (broad-spectrum antibiotics), such as tetracycline commonly can cause thrush.  Birth control pills (oral contraceptives).  Medications that weaken the body's immune system, such as corticosteroids. Environment Exposure over time to certain environmental chemicals, such as benzene and pesticides, can weaken the body's immune system. This increases your risk for developing infections, including thrush. SEEK IMMEDIATE MEDICAL CARE IF:  Your symptoms are getting worse or are not improving within 7 days of starting treatment.  You have symptoms of spreading infection, such as white patches on the skin outside of the mouth.  You are nursing and you have redness and pain in the  nipples in spite of home treatment or if you have burning pain in the nipple area when you nurse. Your baby's mouth should also be examined to determine whether thrush is causing your symptoms. Document Released: 04/08/2004 Document Revised: 10/06/2011 Document Reviewed: 07/19/2008 [facility] Patient Information 2013 [place], [place].  Bacterial Vaginosis Bacterial vaginosis (BV) is a vaginal infection where the normal balance of bacteria in the vagina is disrupted. The normal balance is then replaced by an overgrowth of certain bacteria. There are several different kinds of bacteria that can cause BV. BV is the most common vaginal infection in women of childbearing age. CAUSES   The cause of BV is not fully understood. BV develops when there is an increase or imbalance of harmful bacteria.  Some activities or behaviors can upset the normal balance of bacteria in the vagina and put women at increased risk including:  Having a new sex partner or multiple sex partners.  Douching.  Using an intrauterine device (IUD) for contraception.  It is not clear what role sexual activity plays in the development of BV. However, women that have never had sexual intercourse are rarely infected with BV. Women do not get BV from toilet seats, bedding, swimming pools or from touching objects around them.  SYMPTOMS   Grey vaginal discharge.  A fish-like odor with discharge, especially after sexual intercourse.  Itching or burning of the vagina and vulva.  Burning or pain with urination.  Some women have no signs or symptoms at all. DIAGNOSIS  Your caregiver must examine the vagina for signs of BV. Your caregiver will perform lab tests and look at the sample of vaginal fluid through a microscope. They will look for bacteria and abnormal cells (clue cells), a pH test higher than 4.5, and a positive amine test all associated with BV.  RISKS AND COMPLICATIONS   Pelvic inflammatory disease  (PID).  Infections following gynecology surgery.  Developing HIV.  Developing herpes virus. TREATMENT  Sometimes BV will clear up without treatment. However, all women with symptoms of BV should be treated to avoid complications, especially if gynecology surgery is planned. Female partners generally do not need to be treated. However, BV may spread between female sex partners so treatment is helpful in preventing a  recurrence of BV.   BV may be treated with antibiotics. The antibiotics come in either pill or vaginal cream forms. Either can be used with nonpregnant or pregnant women, but the recommended dosages differ. These antibiotics are not harmful to the baby.  BV can recur after treatment. If this happens, a second round of antibiotics will often be prescribed.  Treatment is important for pregnant women. If not treated, BV can cause a premature delivery, especially for a pregnant woman who had a premature birth in the past. All pregnant women who have symptoms of BV should be checked and treated.  For chronic reoccurrence of BV, treatment with a type of prescribed gel vaginally twice a week is helpful. HOME CARE INSTRUCTIONS   Finish all medication as directed by your caregiver.  Do not have sex until treatment is completed.  Tell your sexual partner that you have a vaginal infection. They should see their caregiver and be treated if they have problems, such as a mild rash or itching.  Practice safe sex. Use condoms. Only have 1 sex partner. PREVENTION  Basic prevention steps can help reduce the risk of upsetting the natural balance of bacteria in the vagina and developing BV:  Do not have sexual intercourse (be abstinent).  Do not douche.  Use all of the medicine prescribed for treatment of BV, even if the signs and symptoms go away.  Tell your sex partner if you have BV. That way, they can be treated, if needed, to prevent reoccurrence. SEEK MEDICAL CARE IF:   Your symptoms  are not improving after 3 days of treatment.  You have increased discharge, pain, or fever. MAKE SURE YOU:   Understand these instructions.  Will watch your condition.  Will get help right away if you are not doing well or get worse. FOR MORE INFORMATION  Division of STD Prevention (DSTDP), Centers for Disease Control and Prevention: SolutionApps.co.za American Social Health Association (ASHA): www.ashastd.org  Document Released: 07/14/2005 Document Revised: 10/06/2011 Document Reviewed: 01/04/2009 [facility] Patient Information 2013 [place], [place].

## 2012-08-12 LAB — URINALYSIS W MICROSCOPIC + REFLEX CULTURE
Bacteria, UA: NONE SEEN
Bilirubin Urine: NEGATIVE
Casts: NONE SEEN
Crystals: NONE SEEN
Glucose, UA: NEGATIVE mg/dL
Hgb urine dipstick: NEGATIVE
Ketones, ur: NEGATIVE mg/dL
Leukocytes, UA: NEGATIVE
Nitrite: NEGATIVE
Protein, ur: NEGATIVE mg/dL
Specific Gravity, Urine: 1.016 (ref 1.005–1.030)
Squamous Epithelial / LPF: NONE SEEN
Urobilinogen, UA: 1 mg/dL (ref 0.0–1.0)
pH: 7.5 (ref 5.0–8.0)

## 2012-08-12 LAB — GC/CHLAMYDIA PROBE AMP
CT Probe RNA: NEGATIVE
GC Probe RNA: NEGATIVE

## 2012-08-12 LAB — CHOLESTEROL, TOTAL: Cholesterol: 177 mg/dL (ref 0–200)

## 2012-08-12 LAB — TSH: TSH: 1.514 u[IU]/mL (ref 0.350–4.500)

## 2012-09-15 ENCOUNTER — Telehealth: Payer: Self-pay | Admitting: *Deleted

## 2012-09-15 NOTE — Telephone Encounter (Signed)
Pt left message in voicemail c/o another BV infection requesting RX. Left message on pt voicemail to make OV with JF.

## 2012-09-16 ENCOUNTER — Encounter: Payer: Self-pay | Admitting: Gynecology

## 2012-09-16 ENCOUNTER — Ambulatory Visit (INDEPENDENT_AMBULATORY_CARE_PROVIDER_SITE_OTHER): Payer: BC Managed Care – PPO | Admitting: Gynecology

## 2012-09-16 VITALS — BP 128/80

## 2012-09-16 DIAGNOSIS — N898 Other specified noninflammatory disorders of vagina: Secondary | ICD-10-CM

## 2012-09-16 DIAGNOSIS — A499 Bacterial infection, unspecified: Secondary | ICD-10-CM

## 2012-09-16 DIAGNOSIS — B9689 Other specified bacterial agents as the cause of diseases classified elsewhere: Secondary | ICD-10-CM

## 2012-09-16 DIAGNOSIS — N760 Acute vaginitis: Secondary | ICD-10-CM

## 2012-09-16 DIAGNOSIS — B373 Candidiasis of vulva and vagina: Secondary | ICD-10-CM

## 2012-09-16 LAB — WET PREP FOR TRICH, YEAST, CLUE: Trich, Wet Prep: NONE SEEN

## 2012-09-16 MED ORDER — TINIDAZOLE 500 MG PO TABS: ORAL_TABLET | ORAL | Status: DC

## 2012-09-16 MED ORDER — FLUCONAZOLE 100 MG PO TABS: ORAL_TABLET | ORAL | Status: DC

## 2012-09-16 NOTE — Progress Notes (Signed)
Patient presented to the office complaining of vaginal pruritus and discharge. She was seen in the  office in January for her annual exam and was found to have bacterial vaginosis and was treated with Cleocin vaginal cream. GC and Chlamydia culture was negative. Patient stating that she is not sexually active. She was also treated for oral thrush with Mycostatin 100,000 units per mL suspension 5 cc 4 times a day for 2-3 days and it cleared up. Patient currently has a Mirena IUD.    Exam: Bartholin urethra Skene was within normal limits Vagina: Slight fishy clear discharge was noted Cervix: No gross lesions on inspection Uterus: Not in examined Rectal exam: Not examined  Wet prep: Few yeast, few clue cells, few white blood cells, too numerous to count bacteria  Assessment/plan: Since it was approximately a month since the patient was treated for BV we are going to give her an extended treatment as follows: Tindamax 1 g by mouth daily for 5 days. One week of rest. The following week to take 10 max 1 g by mouth for 5 days as well. 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

## 2012-09-16 NOTE — Patient Instructions (Addendum)
Candidal Vulvovaginitis Candidal vulvovaginitis is an infection of the vagina and vulva. The vulva is the skin around the opening of the vagina. This may cause itching and discomfort in and around the vagina.  HOME CARE  Only take medicine as told by your doctor.  Do not have sex (intercourse) until the infection is healed or as told by your doctor.  Practice safe sex.  Tell your sex partner about your infection.  Do not douche or use tampons.  Wear cotton underwear. Do not wear tight pants or panty hose.  Eat yogurt. This may help treat and prevent yeast infections. GET HELP RIGHT AWAY IF:   You have a fever.  Your problems get worse during treatment or do not get better in 3 days.  You have discomfort, irritation, or itching in your vagina or vulva area.  You have pain after sex.  You start to get belly (abdominal) pain. MAKE SURE YOU:  Understand these instructions.  Will watch your condition.  Will get help right away if you are not doing well or get worse. Document Released: 10/10/2008 Document Revised: 10/06/2011 Document Reviewed: 10/10/2008 [facility] Patient Information 2013 [place], [place].  Bacterial Vaginosis Bacterial vaginosis (BV) is a vaginal infection where the normal balance of bacteria in the vagina is disrupted. The normal balance is then replaced by an overgrowth of certain bacteria. There are several different kinds of bacteria that can cause BV. BV is the most common vaginal infection in women of childbearing age. CAUSES   The cause of BV is not fully understood. BV develops when there is an increase or imbalance of harmful bacteria.  Some activities or behaviors can upset the normal balance of bacteria in the vagina and put women at increased risk including:  Having a new sex partner or multiple sex partners.  Douching.  Using an intrauterine device (IUD) for contraception.  It is not clear what role sexual activity plays in the development of  BV. However, women that have never had sexual intercourse are rarely infected with BV. Women do not get BV from toilet seats, bedding, swimming pools or from touching objects around them.  SYMPTOMS   Grey vaginal discharge.  A fish-like odor with discharge, especially after sexual intercourse.  Itching or burning of the vagina and vulva.  Burning or pain with urination.  Some women have no signs or symptoms at all. DIAGNOSIS  Your caregiver must examine the vagina for signs of BV. Your caregiver will perform lab tests and look at the sample of vaginal fluid through a microscope. They will look for bacteria and abnormal cells (clue cells), a pH test higher than 4.5, and a positive amine test all associated with BV.  RISKS AND COMPLICATIONS   Pelvic inflammatory disease (PID).  Infections following gynecology surgery.  Developing HIV.  Developing herpes virus. TREATMENT  Sometimes BV will clear up without treatment. However, all women with symptoms of BV should be treated to avoid complications, especially if gynecology surgery is planned. Female partners generally do not need to be treated. However, BV may spread between female sex partners so treatment is helpful in preventing a recurrence of BV.   BV may be treated with antibiotics. The antibiotics come in either pill or vaginal cream forms. Either can be used with nonpregnant or pregnant women, but the recommended dosages differ. These antibiotics are not harmful to the baby.  BV can recur after treatment. If this happens, a second round of antibiotics will often be prescribed.  Treatment  is important for pregnant women. If not treated, BV can cause a premature delivery, especially for a pregnant woman who had a premature birth in the past. All pregnant women who have symptoms of BV should be checked and treated.  For chronic reoccurrence of BV, treatment with a type of prescribed gel vaginally twice a week is helpful. HOME CARE  INSTRUCTIONS   Finish all medication as directed by your caregiver.  Do not have sex until treatment is completed.  Tell your sexual partner that you have a vaginal infection. They should see their caregiver and be treated if they have problems, such as a mild rash or itching.  Practice safe sex. Use condoms. Only have 1 sex partner. PREVENTION  Basic prevention steps can help reduce the risk of upsetting the natural balance of bacteria in the vagina and developing BV:  Do not have sexual intercourse (be abstinent).  Do not douche.  Use all of the medicine prescribed for treatment of BV, even if the signs and symptoms go away.  Tell your sex partner if you have BV. That way, they can be treated, if needed, to prevent reoccurrence. SEEK MEDICAL CARE IF:   Your symptoms are not improving after 3 days of treatment.  You have increased discharge, pain, or fever. MAKE SURE YOU:   Understand these instructions.  Will watch your condition.  Will get help right away if you are not doing well or get worse. FOR MORE INFORMATION  Division of STD Prevention (DSTDP), Centers for Disease Control and Prevention: [employer] American Social Health Association (ASHA): www.ashastd.org  Document Released: 07/14/2005 Document Revised: 10/06/2011 Document Reviewed: 01/04/2009 [facility] Patient Information 2013 [place], [place].

## 2012-09-17 NOTE — Addendum Note (Signed)
Addended by: Dayna Barker on: 09/17/2012 04:56 PM   Modules accepted: Orders

## 2012-10-22 ENCOUNTER — Encounter: Payer: Self-pay | Admitting: Gynecology

## 2012-10-22 ENCOUNTER — Ambulatory Visit (INDEPENDENT_AMBULATORY_CARE_PROVIDER_SITE_OTHER): Payer: BC Managed Care – PPO | Admitting: Gynecology

## 2012-10-22 DIAGNOSIS — N760 Acute vaginitis: Secondary | ICD-10-CM

## 2012-10-22 DIAGNOSIS — A499 Bacterial infection, unspecified: Secondary | ICD-10-CM

## 2012-10-22 DIAGNOSIS — B9689 Other specified bacterial agents as the cause of diseases classified elsewhere: Secondary | ICD-10-CM

## 2012-10-22 DIAGNOSIS — N898 Other specified noninflammatory disorders of vagina: Secondary | ICD-10-CM

## 2012-10-22 LAB — WET PREP FOR TRICH, YEAST, CLUE
Trich, Wet Prep: NONE SEEN
Yeast Wet Prep HPF POC: NONE SEEN

## 2012-10-22 MED ORDER — METRONIDAZOLE 0.75 % VA GEL: VAGINAL | Status: DC

## 2012-10-22 NOTE — Progress Notes (Signed)
This patient presented to the office today complaining of vaginal pruritus and today started having slight discharge with fishy light odor. Patient has a Mirena IUD for contraception she is currently also in a monogamous relationship. Patient has had episodes in the past or current BV. She also has been treated several times for moniliasis.  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 Will notify the patient with the results become available.

## 2012-10-22 NOTE — Patient Instructions (Addendum)
Bacterial Vaginosis Bacterial vaginosis (BV) is a vaginal infection where the normal balance of bacteria in the vagina is disrupted. The normal balance is then replaced by an overgrowth of certain bacteria. There are several different kinds of bacteria that can cause BV. BV is the most common vaginal infection in women of childbearing age. CAUSES   The cause of BV is not fully understood. BV develops when there is an increase or imbalance of harmful bacteria.  Some activities or behaviors can upset the normal balance of bacteria in the vagina and put women at increased risk including:  Having a new sex partner or multiple sex partners.  Douching.  Using an intrauterine device (IUD) for contraception.  It is not clear what role sexual activity plays in the development of BV. However, women that have never had sexual intercourse are rarely infected with BV. Women do not get BV from toilet seats, bedding, swimming pools or from touching objects around them.  SYMPTOMS   Grey vaginal discharge.  A fish-like odor with discharge, especially after sexual intercourse.  Itching or burning of the vagina and vulva.  Burning or pain with urination.  Some women have no signs or symptoms at all. DIAGNOSIS  Your caregiver must examine the vagina for signs of BV. Your caregiver will perform lab tests and look at the sample of vaginal fluid through a microscope. They will look for bacteria and abnormal cells (clue cells), a pH test higher than 4.5, and a positive amine test all associated with BV.  RISKS AND COMPLICATIONS   Pelvic inflammatory disease (PID).  Infections following gynecology surgery.  Developing HIV.  Developing herpes virus. TREATMENT  Sometimes BV will clear up without treatment. However, all women with symptoms of BV should be treated to avoid complications, especially if gynecology surgery is planned. Female partners generally do not need to be treated. However, BV may spread  between female sex partners so treatment is helpful in preventing a recurrence of BV.   BV may be treated with antibiotics. The antibiotics come in either pill or vaginal cream forms. Either can be used with nonpregnant or pregnant women, but the recommended dosages differ. These antibiotics are not harmful to the baby.  BV can recur after treatment. If this happens, a second round of antibiotics will often be prescribed.  Treatment is important for pregnant women. If not treated, BV can cause a premature delivery, especially for a pregnant woman who had a premature birth in the past. All pregnant women who have symptoms of BV should be checked and treated.  For chronic reoccurrence of BV, treatment with a type of prescribed gel vaginally twice a week is helpful. HOME CARE INSTRUCTIONS   Finish all medication as directed by your caregiver.  Do not have sex until treatment is completed.  Tell your sexual partner that you have a vaginal infection. They should see their caregiver and be treated if they have problems, such as a mild rash or itching.  Practice safe sex. Use condoms. Only have 1 sex partner. PREVENTION  Basic prevention steps can help reduce the risk of upsetting the natural balance of bacteria in the vagina and developing BV:  Do not have sexual intercourse (be abstinent).  Do not douche.  Use all of the medicine prescribed for treatment of BV, even if the signs and symptoms go away.  Tell your sex partner if you have BV. That way, they can be treated, if needed, to prevent reoccurrence. SEEK MEDICAL CARE IF:     Your symptoms are not improving after 3 days of treatment.  You have increased discharge, pain, or fever. MAKE SURE YOU:   Understand these instructions.  Will watch your condition.  Will get help right away if you are not doing well or get worse. FOR MORE INFORMATION  Division of STD Prevention (DSTDP), Centers for Disease Control and Prevention:  www.cdc.gov/std [employer] Association (ASHA): www.ashastd.org  Document Released: 07/14/2005 Document Revised: 10/06/2011 Document Reviewed: 01/04/2009 [employer] Patient Information 2013 [employer].  

## 2012-12-03 ENCOUNTER — Encounter: Payer: Self-pay | Admitting: Gynecology

## 2013-02-21 ENCOUNTER — Encounter: Payer: Self-pay | Admitting: Gynecology

## 2013-02-21 ENCOUNTER — Ambulatory Visit (INDEPENDENT_AMBULATORY_CARE_PROVIDER_SITE_OTHER): Payer: BC Managed Care – PPO | Admitting: Gynecology

## 2013-02-21 VITALS — BP 126/78

## 2013-02-21 DIAGNOSIS — M549 Dorsalgia, unspecified: Secondary | ICD-10-CM

## 2013-02-21 DIAGNOSIS — R300 Dysuria: Secondary | ICD-10-CM

## 2013-02-21 DIAGNOSIS — IMO0001 Reserved for inherently not codable concepts without codable children: Secondary | ICD-10-CM

## 2013-02-21 DIAGNOSIS — R350 Frequency of micturition: Secondary | ICD-10-CM

## 2013-02-21 DIAGNOSIS — N898 Other specified noninflammatory disorders of vagina: Secondary | ICD-10-CM

## 2013-02-21 LAB — URINALYSIS W MICROSCOPIC + REFLEX CULTURE
Bacteria, UA: NONE SEEN
Bilirubin Urine: NEGATIVE
Casts: NONE SEEN
Crystals: NONE SEEN
Glucose, UA: NEGATIVE mg/dL
Ketones, ur: NEGATIVE mg/dL
Leukocytes, UA: NEGATIVE
Nitrite: NEGATIVE
Protein, ur: NEGATIVE mg/dL
Specific Gravity, Urine: 1.015 (ref 1.005–1.030)
Urobilinogen, UA: 1 mg/dL (ref 0.0–1.0)
WBC, UA: NONE SEEN WBC/hpf (ref <3)
pH: 8 (ref 5.0–8.0)

## 2013-02-21 LAB — WET PREP FOR TRICH, YEAST, CLUE
Clue Cells Wet Prep HPF POC: NONE SEEN
Trich, Wet Prep: NONE SEEN

## 2013-02-21 MED ORDER — PHENAZOPYRIDINE HCL 200 MG PO TABS: 200.0000 mg | ORAL_TABLET | Freq: Three times a day (TID) | ORAL | Status: DC | PRN

## 2013-02-21 MED ORDER — NITROFURANTOIN MONOHYD MACRO 100 MG PO CAPS: 100.0000 mg | ORAL_CAPSULE | Freq: Two times a day (BID) | ORAL | Status: DC

## 2013-02-21 MED ORDER — FLUCONAZOLE 100 MG PO TABS: ORAL_TABLET | ORAL | Status: DC

## 2013-02-21 NOTE — Progress Notes (Addendum)
Patient presented to the office today complaining of several days of dysuria, frequency, low back discomfort and pelvic pressure. The patient had been seen in the office in March of this year and treated for bacterial vaginosis and she is currently on six-month treatment due to recurrency with MetroGel twice a week.  Patient has Marine IUD for contraception and is having minimal regular cycles. She denied any fever, chills, nausea, or vomiting.  Exam: Back: No CVA tenderness : Abdomen soft nontender some slight suprapubic tenderness noted Vagina: No lesions or discharge Cervix: No lesions or discharge IUD string is seen Uterus: Anteverted tender suprapubically adnexa: No palpable masses or tenderness  Urinalysis of 0-2 RBC  Assessment/plan: Clinical symptoms suspicious of urinary tract infection. Patient was prescribed Pyridium  200 mg 1 by mouth 3 times a day for 2 days as we wait the results of the urine culture. Temporarily she was given a prescription Macrobid to take 1 by mouth twice a day for 7 days. She was encouraged to increase her fluid intake.patient is in a monogamous relationship.

## 2013-02-21 NOTE — Patient Instructions (Signed)
Urinary Tract Infection  Urinary tract infections (UTIs) can develop anywhere along your urinary tract. Your urinary tract is your body's drainage system for removing wastes and extra water. Your urinary tract includes two kidneys, two ureters, a bladder, and a urethra. Your kidneys are a pair of bean-shaped organs. Each kidney is about the size of your fist. They are located below your ribs, one on each side of your spine.  CAUSES  Infections are caused by microbes, which are microscopic organisms, including fungi, viruses, and bacteria. These organisms are so small that they can only be seen through a microscope. Bacteria are the microbes that most commonly cause UTIs.  SYMPTOMS   Symptoms of UTIs may vary by age and gender of the patient and by the location of the infection. Symptoms in young women typically include a frequent and intense urge to urinate and a painful, burning feeling in the bladder or urethra during urination. Older women and men are more likely to be tired, shaky, and weak and have muscle aches and abdominal pain. A fever may mean the infection is in your kidneys. Other symptoms of a kidney infection include pain in your back or sides below the ribs, nausea, and vomiting.  DIAGNOSIS  To diagnose a UTI, your caregiver will ask you about your symptoms. Your caregiver also will ask to provide a urine sample. The urine sample will be tested for bacteria and white blood cells. White blood cells are made by your body to help fight infection.  TREATMENT   Typically, UTIs can be treated with medication. Because most UTIs are caused by a bacterial infection, they usually can be treated with the use of antibiotics. The choice of antibiotic and length of treatment depend on your symptoms and the type of bacteria causing your infection.  HOME CARE INSTRUCTIONS   If you were prescribed antibiotics, take them exactly as your caregiver instructs you. Finish the medication even if you feel better after you  have only taken some of the medication.   Drink enough water and fluids to keep your urine clear or pale yellow.   Avoid caffeine, tea, and carbonated beverages. They tend to irritate your bladder.   Empty your bladder often. Avoid holding urine for long periods of time.   Empty your bladder before and after sexual intercourse.   After a bowel movement, women should cleanse from front to back. Use each tissue only once.  SEEK MEDICAL CARE IF:    You have back pain.   You develop a fever.   Your symptoms do not begin to resolve within 3 days.  SEEK IMMEDIATE MEDICAL CARE IF:    You have severe back pain or lower abdominal pain.   You develop chills.   You have nausea or vomiting.   You have continued burning or discomfort with urination.  MAKE SURE YOU:    Understand these instructions.   Will watch your condition.   Will get help right away if you are not doing well or get worse.  Document Released: 04/23/2005 Document Revised: 01/13/2012 Document Reviewed: 08/22/2011  [employer] Patient Information 2014 [employer].

## 2013-02-22 LAB — URINE CULTURE: Colony Count: 8000

## 2013-06-02 ENCOUNTER — Other Ambulatory Visit: Payer: Self-pay

## 2014-03-28 ENCOUNTER — Telehealth: Payer: Self-pay | Admitting: *Deleted

## 2014-03-28 ENCOUNTER — Encounter: Payer: Self-pay | Admitting: Gynecology

## 2014-03-28 ENCOUNTER — Ambulatory Visit (INDEPENDENT_AMBULATORY_CARE_PROVIDER_SITE_OTHER): Payer: BC Managed Care – PPO | Admitting: Gynecology

## 2014-03-28 ENCOUNTER — Other Ambulatory Visit (HOSPITAL_COMMUNITY)
Admission: RE | Admit: 2014-03-28 | Discharge: 2014-03-28 | Disposition: A | Discharge status: Home / Self Care | Payer: BC Managed Care – PPO | Source: Ambulatory Visit | Attending: Gynecology | Admitting: Gynecology

## 2014-03-28 VITALS — BP 136/84 | Ht 63.0 in | Wt 170.0 lb

## 2014-03-28 DIAGNOSIS — K5900 Constipation, unspecified: Secondary | ICD-10-CM

## 2014-03-28 DIAGNOSIS — Z8742 Personal history of other diseases of the female genital tract: Secondary | ICD-10-CM

## 2014-03-28 DIAGNOSIS — Z01419 Encounter for gynecological examination (general) (routine) without abnormal findings: Secondary | ICD-10-CM

## 2014-03-28 DIAGNOSIS — Z87411 Personal history of vaginal dysplasia: Secondary | ICD-10-CM

## 2014-03-28 DIAGNOSIS — Z124 Encounter for screening for malignant neoplasm of cervix: Secondary | ICD-10-CM | POA: Diagnosis not present

## 2014-03-28 DIAGNOSIS — Z1151 Encounter for screening for human papillomavirus (HPV): Secondary | ICD-10-CM | POA: Insufficient documentation

## 2014-03-28 DIAGNOSIS — R102 Pelvic and perineal pain: Secondary | ICD-10-CM

## 2014-03-28 DIAGNOSIS — N949 Unspecified condition associated with female genital organs and menstrual cycle: Secondary | ICD-10-CM

## 2014-03-28 LAB — LIPID PANEL
Cholesterol: 155 mg/dL (ref 0–200)
HDL: 51 mg/dL (ref >39)
LDL Cholesterol: 97 mg/dL (ref 0–99)
Total CHOL/HDL Ratio: 3 Ratio
Triglycerides: 36 mg/dL (ref <150)
VLDL: 7 mg/dL (ref 0–40)

## 2014-03-28 LAB — CBC WITH DIFFERENTIAL/PLATELET
Basophils Absolute: 0 10*3/uL (ref 0.0–0.1)
Basophils Relative: 0 % (ref 0–1)
Eosinophils Absolute: 0 10*3/uL (ref 0.0–0.7)
Eosinophils Relative: 1 % (ref 0–5)
HCT: 34.1 % — ABNORMAL LOW (ref 36.0–46.0)
Hemoglobin: 11.5 g/dL — ABNORMAL LOW (ref 12.0–15.0)
Lymphocytes Relative: 38 % (ref 12–46)
Lymphs Abs: 1.5 10*3/uL (ref 0.7–4.0)
MCH: 31.7 pg (ref 26.0–34.0)
MCHC: 33.7 g/dL (ref 30.0–36.0)
MCV: 93.9 fL (ref 78.0–100.0)
Monocytes Absolute: 0.3 10*3/uL (ref 0.1–1.0)
Monocytes Relative: 7 % (ref 3–12)
Neutro Abs: 2.2 10*3/uL (ref 1.7–7.7)
Neutrophils Relative %: 54 % (ref 43–77)
Platelets: 209 10*3/uL (ref 150–400)
RBC: 3.63 MIL/uL — ABNORMAL LOW (ref 3.87–5.11)
RDW: 13.5 % (ref 11.5–15.5)
WBC: 4 10*3/uL (ref 4.0–10.5)

## 2014-03-28 LAB — COMPREHENSIVE METABOLIC PANEL
ALT: 11 U/L (ref 0–35)
AST: 18 U/L (ref 0–37)
Albumin: 4.4 g/dL (ref 3.5–5.2)
Alkaline Phosphatase: 39 U/L (ref 39–117)
BUN: 6 mg/dL (ref 6–23)
CO2: 27 mEq/L (ref 19–32)
Calcium: 8.9 mg/dL (ref 8.4–10.5)
Chloride: 103 mEq/L (ref 96–112)
Creat: 0.78 mg/dL (ref 0.50–1.10)
Glucose, Bld: 84 mg/dL (ref 70–99)
Potassium: 3.7 mEq/L (ref 3.5–5.3)
Sodium: 137 mEq/L (ref 135–145)
Total Bilirubin: 0.9 mg/dL (ref 0.2–1.2)
Total Protein: 6.7 g/dL (ref 6.0–8.3)

## 2014-03-28 LAB — TSH: TSH: 1.215 u[IU]/mL (ref 0.350–4.500)

## 2014-03-28 MED ORDER — LINACLOTIDE 145 MCG PO CAPS: 145.0000 ug | ORAL_CAPSULE | Freq: Every day | ORAL | Status: DC

## 2014-03-28 MED ORDER — PAROXETINE HCL ER 12.5 MG PO TB24: 12.5000 mg | ORAL_TABLET | ORAL | Status: DC

## 2014-03-28 NOTE — Addendum Note (Signed)
Addended by: Berna Spare A on: 03/28/2014 12:13 PM   Modules accepted: Orders

## 2014-03-28 NOTE — Progress Notes (Signed)
Sabrina [name] [birth year] [id number]   History:    38 y.o.  for annual gyn exam has been tearful and anxious since her daughter went away to College 2 weeks ago. She has been complaining of right lower abdominal discomfort. Patient several years ago had history of ovarian cysts which resolved spontaneously. She had a Mirena IUD placed in 2013. Patient complained of weight gain as well. She has very little menstrual cycle as a result of the IUD. She recently got married a year ago. 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  Past medical history,surgical history, family history and social history were all reviewed and documented in the EPIC chart.  Gynecologic History No LMP recorded. Patient is not currently having periods (Reason: IUD). Contraception: IUD Last Pap: 2013. Results were: normal Last mammogram: 6 months ago. Results were: Breast cysts recommended diagnostic mammogram in 6 months  Obstetric History OB History  Gravida Para Term Preterm AB SAB TAB Ectopic Multiple Living  # Outcome Date GA Lbr Len/2nd Weight Sex Delivery Anes PTL Lv  1 TRM     F CS  N Y       ROS: A ROS was performed and pertinent positives and negatives are included in the history.  GENERAL: No fevers or chills. HEENT: No change in vision, no earache, sore throat or sinus congestion. NECK: No pain or stiffness. CARDIOVASCULAR: No chest pain or pressure. No palpitations. PULMONARY: No shortness of breath, cough or wheeze. GASTROINTESTINAL: Constipation GENITOURINARY: No urinary frequency, urgency, hesitancy or dysuria. MUSCULOSKELETAL: No joint or muscle pain, no back pain, no recent trauma. DERMATOLOGIC: No rash, no itching, no lesions. ENDOCRINE: No polyuria, polydipsia, no heat or cold intolerance. No recent change in weight. HEMATOLOGICAL: No anemia or easy bruising or bleeding. NEUROLOGIC: No headache, seizures, numbness, tingling or weakness. PSYCHIATRIC: No  depression, no loss of interest in normal activity or change in sleep pattern.     Exam: chaperone present  BP 136/84  Ht  (1.6 m)  Wt 170 lb (77.111 kg)  BMI 30.12 kg/m2  Body mass index is 30.12 kg/(m^2).  General appearance : Well developed well nourished female. No acute distress HEENT: Neck supple, trachea midline, no carotid bruits, no thyroidmegaly Lungs: Clear to auscultation, no rhonchi or wheezes, or rib retractions  Heart: Regular rate and rhythm, no murmurs or gallops Breast:Examined in sitting and supine position were symmetrical in appearance, no palpable masses or tenderness,  no skin retraction, no nipple inversion, no nipple discharge, no skin discoloration, no axillary or supraclavicular lymphadenopathy Abdomen: no palpable masses or tenderness, no rebound or guarding Extremities: no edema or skin discoloration or tenderness  Pelvic:  Bartholin, Urethra, Skene Glands: Within normal limits             Vagina: No gross lesions or discharge  Cervix: No gross lesions or discharge  Uterus  anteverted, normal size, shape and consistency, non-tender and mobile  Adnexa  right adnexal tenderness  Anus and perineum  normal   Rectovaginal  normal sphincter tone without palpated masses or tenderness             Hemoccult not indicated     Assessment/Plan:  38 y.o. female for annual exam with several issues. #1 separation anxiety from her daughter who went off to College 2 weeks ago. She will be started on Paxil CR 12.5 mg daily. Literature information  was provided. Risks benefits pros and cons were discussed. We will keep her on medication for 6 months and then gradually taper off as instructed. #2 chronic constipation/IBS will be started on Linzess 1 tablet 30 minutes before breakfast daily. We encouraged her to increase her fluid intake as well as high fiber diet. #3 right lower quadrant discomfort questionable fullness but an ultrasound in one-to 2 weeks. The  following labs were ordered today: Fasting lipid profile, comprehensive metabolic panel, TSH, CBC, and urinalysis along with Pap smear. She was reminded to follow up for a recommended mammogram. We discussed importance of monthly self breast examination. We discussed importance of calcium vitamin E and regular exercise for osteoporosis prevention.  Note: This dictation was prepared with  Dragon/digital dictation along withSmart phrase technology. Any transcriptional errors that result from this process are unintentional.   Ok Edwards MD, 11:43 AM 03/28/2014

## 2014-03-28 NOTE — Patient Instructions (Addendum)
Linaclotide oral capsules What is this medicine? LINACLOTIDE (lin a KLOE tide) is used to treat irritable bowel syndrome (IBS) with constipation as the main problem. It may also be used for relief of chronic constipation. This medicine may be used for other purposes; ask your health care provider or pharmacist if you have questions. COMMON BRAND NAME(S): Linzess What should I tell my health care provider before I take this medicine? They need to know if you have any of these conditions: -history of stool (fecal) impaction -now have diarrhea or have diarrhea often -other medical condition -stomach or intestinal disease, including bowel obstruction or abdominal adhesions -an unusual or allergic reaction to linaclotide, other medicines, foods, dyes, or preservatives -pregnant or trying to get pregnant -breast-feeding How should I use this medicine? Take this medicine by mouth with a glass of water. Follow the directions on the prescription label. Do not cut, crush or chew this medicine. Take on an empty stomach, at least 30 minutes before your first meal of the day. Take your medicine at regular intervals. Do not take your medicine more often than directed. Do not stop taking except on your doctor's advice. A special MedGuide will be given to you by the pharmacist with each prescription and refill. Be sure to read this information carefully each time. Talk to your pediatrician regarding the use of this medicine in children. This medicine is not approved for use in children. Overdosage: If you think you've taken too much of this medicine contact a poison control center or emergency room at once. Overdosage: If you think you have taken too much of this medicine contact a poison control center or emergency room at once. NOTE: This medicine is only for you. Do not share this medicine with others. What if I miss a dose? If you miss a dose, just skip that dose. Wait until your next dose, and take only  that dose. Do not take double or extra doses. What may interact with this medicine? -certain medicines for bowel problems or bladder incontinence (these can cause constipation) This list may not describe all possible interactions. Give your health care provider a list of all the medicines, herbs, non-prescription drugs, or dietary supplements you use. Also tell them if you smoke, drink alcohol, or use illegal drugs. Some items may interact with your medicine. What should I watch for while using this medicine? Visit your doctor for regular check ups. Tell your doctor if your symptoms do not get better or if they get worse. Diarrhea is a common side effect of this medicine. It often begins within 2 weeks of starting this medicine. Stop taking this medicine and call your doctor if you get severe diarrhea. Stop taking this medicine and call your doctor or go to the nearest hospital emergency room right away if you develop unusual or severe stomach-area (abdominal) pain, especially if you also have bright red, bloody stools or black stools that look like tar. What side effects may I notice from receiving this medicine? Side effects that you should report to your doctor or health care professional as soon as possible: -allergic reactions like skin rash, itching or hives, swelling of the face, lips, or tongue -black, tarry stools -bloody or watery diarrhea -new or worsening stomach pain -severe or prolonged diarrhea Side effects that usually do not require medical attention (Report these to your doctor or health care professional if they continue or are bothersome.): -bloating -gas -loose stools This list may not describe all possible side effects.  Call your doctor for medical advice about side effects. You may report side effects to FDA at 1-800-FDA-1088. Where should I keep my medicine? Keep out of the reach of children. Store at room temperature between 20 and 25 degrees C (68 and 77 degrees F).  Keep this medicine in the original container. Keep tightly closed in a dry place. Do not remove the desiccant packet from the bottle, it helps to protect your medicine from moisture. Throw away any unused medicine after the expiration date. NOTE: This sheet is a summary. It may not cover all possible information. If you have questions about this medicine, talk to your doctor, pharmacist, or health care provider.  2015, Elsevier/Gold Standard. (2011-04-01 13:05:27) Paroxetine Controlled-Release Tablets What is this medicine? PAROXETINE (pa ROX e teen) is used to treat depression. It may also be used to treat anxiety disorders, obsessive compulsive disorder, panic attacks, post traumatic stress, and premenstrual dysphoric disorder (PMDD). This medicine may be used for other purposes; ask your health care provider or pharmacist if you have questions. COMMON BRAND NAME(S): Paxil CR What should I tell my health care provider before I take this medicine? They need to know if you have any of these conditions: -bleeding disorders -glaucoma -heart disease -kidney disease -liver disease -low levels of sodium in the blood -mania or bipolar disorder -seizures -suicidal thoughts, plans, or attempt; a previous suicide attempt by you or a family member -take MAOIs like Carbex, Eldepryl, Marplan, Nardil, and Parnate -take medicines that treat or prevent blood clots -an unusual or allergic reaction to paroxetine, other medicines, foods, dyes, or preservatives -pregnant or trying to get pregnant -breast-feeding How should I use this medicine? Take this medicine by mouth with a glass of water. Follow the directions on the prescription label. You can take it with or without food. Do not crush or chew this medicine. Take your medicine at regular intervals. Do not take your medicine more often than directed. Do not stop taking this medicine suddenly except upon the advice of your doctor. Stopping this medicine  too quickly may cause serious side effects or your condition may worsen. A special MedGuide will be given to you by the pharmacist with each prescription and refill. Be sure to read this information carefully each time. Talk to your pediatrician regarding the use of this medicine in children. Special care may be needed. Overdosage: If you think you have taken too much of this medicine contact a poison control center or emergency room at once. NOTE: This medicine is only for you. Do not share this medicine with others. What if I miss a dose? If you miss a dose, take it as soon as you can. If it is almost time for your next dose, take only that dose. Do not take double or extra doses. What may interact with this medicine? Do not take this medicine with any of the following medications: -linezolid -MAOIs like Carbex, Eldepryl, Marplan, Nardil, and Parnate -methylene blue (injected into a vein) -pimozide -thioridazine This medicine may also interact with the following medications: -alcohol -antacids -aspirin and aspirin-like medicines -atomoxetine -certain medicines for depression, anxiety, or psychotic disturbances -certain medicines for irregular heart beat like propafenone, flecainide, encainide, and quinidine -certain medicines for migraine headache like almotriptan, eletriptan, frovatriptan, naratriptan, rizatriptan, sumatriptan, zolmitriptan -cimetidine -digoxin -diuretics -fentanyl -fosamprenavir/ritonavir -furazolidone -isoniazid -lithium -medicines that treat or prevent blood clots like warfarin, enoxaparin, and dalteparin -medicines for sleep -metoprolol -NSAIDs, medicines for pain and inflammation, like ibuprofen or naproxen -phenobarbital -phenytoin -  procarbazine -procyclidine -rasagiline -supplements like St. John's wort, kava kava, valerian -tamoxifen -theophylline -tramadol -tryptophan This list may not describe all possible interactions. Give your health care  provider a list of all the medicines, herbs, non-prescription drugs, or dietary supplements you use. Also tell them if you smoke, drink alcohol, or use illegal drugs. Some items may interact with your medicine. What should I watch for while using this medicine? Tell your doctor if your symptoms do not get better or if they get worse. Visit your doctor or health care professional for regular checks on your progress. Because it may take several weeks to see the full effects of this medicine, it is important to continue your treatment as prescribed by your doctor. Patients and their families should watch out for new or worsening thoughts of suicide or depression. Also watch out for sudden changes in feelings such as feeling anxious, agitated, panicky, irritable, hostile, aggressive, impulsive, severely restless, overly excited and hyperactive, or not being able to sleep. If this happens, especially at the beginning of treatment or after a change in dose, call your health care professional. Bonita Quin may get drowsy or dizzy. Do not drive, use machinery, or do anything that needs mental alertness until you know how this medicine affects you. Do not stand or sit up quickly, especially if you are an older patient. This reduces the risk of dizzy or fainting spells. Alcohol may interfere with the effect of this medicine. Avoid alcoholic drinks. Your mouth may get dry. Chewing sugarless gum or sucking hard candy, and drinking plenty of water will help. Contact your doctor if the problem does not go away or is severe. What side effects may I notice from receiving this medicine? Side effects that you should report to your doctor or health care professional as soon as possible: -allergic reactions like skin rash, itching or hives, swelling of the face, lips, or tongue -black or bloody stools, blood in the urine or vomit -fast, irregular heartbeat -hallucination, loss of contact with reality -painful or prolonged erection  (men) -seizures -suicidal thoughts or other mood changes -trouble passing urine or change in the amount of urine -unusual bleeding or bruising -unusually weak or tired -vomiting Side effects that usually do not require medical attention (report to your doctor or health care professional if they continue or are bothersome): -change in appetite, weight -change in sex drive or performance -constipation or diarrhea -difficulty sleeping -drowsy -headache -increased sweating -muscle pain or weakness -tremors This list may not describe all possible side effects. Call your doctor for medical advice about side effects. You may report side effects to FDA at 1-800-FDA-1088. Where should I keep my medicine? Keep out of the reach of children. Store at or below 25 degrees C (77 degrees F). Throw away any unused medicine after the expiration date. NOTE: This sheet is a summary. It may not cover all possible information. If you have questions about this medicine, talk to your doctor, pharmacist, or health care provider.  2015, Elsevier/Gold Standard. (2012-02-26 17:51:56) Generalized Anxiety Disorder Generalized anxiety disorder (GAD) is a mental disorder. It interferes with life functions, including relationships, work, and school. GAD is different from normal anxiety, which everyone experiences at some point in their lives in response to specific life events and activities. Normal anxiety actually helps [place] prepare for and get through these life events and activities. Normal anxiety goes away after the event or activity is over.  GAD causes anxiety that is not necessarily related to specific events  or activities. It also causes excess anxiety in proportion to specific events or activities. The anxiety associated with GAD is also difficult to control. GAD can vary from mild to severe. People with severe GAD can have intense waves of anxiety with physical symptoms (panic attacks).  SYMPTOMS The anxiety  and worry associated with GAD are difficult to control. This anxiety and worry are related to many life events and activities and also occur more days than not for 6 months or longer. People with GAD also have three or more of the following symptoms (one or more in children):  Restlessness.   Fatigue.  Difficulty concentrating.   Irritability.  Muscle tension.  Difficulty sleeping or unsatisfying sleep. DIAGNOSIS GAD is diagnosed through an assessment by your health care provider. Your health care provider will ask you questions aboutyour mood,physical symptoms, and events in your life. Your health care provider may ask you about your medical history and use of alcohol or drugs, including prescription medicines. Your health care provider may also do a physical exam and blood tests. Certain medical conditions and the use of certain substances can cause symptoms similar to those associated with GAD. Your health care provider may refer you to a mental health specialist for further evaluation. TREATMENT The following therapies are usually used to treat GAD:   Medication. Antidepressant medication usually is prescribed for long-term daily control. Antianxiety medicines may be added in severe cases, especially when panic attacks occur.   Talk therapy (psychotherapy). Certain types of talk therapy can be helpful in treating GAD by providing support, education, and guidance. A form of talk therapy called cognitive behavioral therapy can teach you healthy ways to think about and react to daily life events and activities.  Stress managementtechniques. These include yoga, meditation, and exercise and can be very helpful when they are practiced regularly. A mental health specialist can help determine which treatment is best for you. Some people see improvement with one therapy. However, other people require a combination of therapies. Document Released: 11/08/2012 Document Revised: 11/28/2013  Document Reviewed: 11/08/2012 [facility] Patient Information 2015 [place], [place]. This information is not intended to replace advice given to you by your health care provider. Make sure you discuss any questions you have with your health care provider.    History to schedule you recommended followup mammogram

## 2014-03-28 NOTE — Telephone Encounter (Signed)
[facility] called regarding rx for paxil 12.5 CR #1 with 5 refills was sent to pharmacy. Per note today it should be paxil 12.5 CR #30 1 po daily with 5 refills. The correct Rx was sent.

## 2014-03-29 LAB — URINALYSIS W MICROSCOPIC + REFLEX CULTURE
Bacteria, UA: NONE SEEN
Bilirubin Urine: NEGATIVE
Casts: NONE SEEN
Crystals: NONE SEEN
Glucose, UA: NEGATIVE mg/dL
Hgb urine dipstick: NEGATIVE
Ketones, ur: NEGATIVE mg/dL
Leukocytes, UA: NEGATIVE
Nitrite: NEGATIVE
Protein, ur: NEGATIVE mg/dL
Specific Gravity, Urine: 1.027 (ref 1.005–1.030)
Squamous Epithelial / LPF: NONE SEEN
Urobilinogen, UA: 1 mg/dL (ref 0.0–1.0)
pH: 6 (ref 5.0–8.0)

## 2014-03-30 LAB — CYTOLOGY - PAP

## 2014-04-12 ENCOUNTER — Other Ambulatory Visit: Payer: BC Managed Care – PPO

## 2014-04-12 ENCOUNTER — Ambulatory Visit: Payer: BC Managed Care – PPO | Admitting: Gynecology

## 2014-04-12 ENCOUNTER — Encounter: Payer: Self-pay | Admitting: Gynecology

## 2014-04-12 ENCOUNTER — Ambulatory Visit (INDEPENDENT_AMBULATORY_CARE_PROVIDER_SITE_OTHER): Payer: BC Managed Care – PPO | Admitting: Gynecology

## 2014-04-12 ENCOUNTER — Other Ambulatory Visit: Payer: Self-pay | Admitting: Gynecology

## 2014-04-12 ENCOUNTER — Ambulatory Visit (INDEPENDENT_AMBULATORY_CARE_PROVIDER_SITE_OTHER): Payer: BC Managed Care – PPO

## 2014-04-12 DIAGNOSIS — R102 Pelvic and perineal pain: Secondary | ICD-10-CM

## 2014-04-12 DIAGNOSIS — N949 Unspecified condition associated with female genital organs and menstrual cycle: Secondary | ICD-10-CM

## 2014-04-12 DIAGNOSIS — Z8742 Personal history of other diseases of the female genital tract: Secondary | ICD-10-CM

## 2014-04-12 DIAGNOSIS — N83201 Unspecified ovarian cyst, right side: Secondary | ICD-10-CM

## 2014-04-12 DIAGNOSIS — N83209 Unspecified ovarian cyst, unspecified side: Secondary | ICD-10-CM

## 2014-04-12 NOTE — Patient Instructions (Signed)
Ovarian Cyst An ovarian cyst is a fluid-filled sac that forms on an ovary. The ovaries are small organs that produce eggs in women. Various types of cysts can form on the ovaries. Most are not cancerous. Many do not cause problems, and they often go away on their own. Some may cause symptoms and require treatment. Common types of ovarian cysts include:  Functional cysts--These cysts may occur every month during the menstrual cycle. This is normal. The cysts usually go away with the next menstrual cycle if the woman does not get pregnant. Usually, there are no symptoms with a functional cyst.  Endometrioma cysts--These cysts form from the tissue that lines the uterus. They are also called "chocolate cysts" because they become filled with blood that turns brown. This type of cyst can cause pain in the lower abdomen during intercourse and with your menstrual period.  Cystadenoma cysts--This type develops from the cells on the outside of the ovary. These cysts can get very big and cause lower abdomen pain and pain with intercourse. This type of cyst can twist on itself, cut off its blood supply, and cause severe pain. It can also easily rupture and cause a lot of pain.  Dermoid cysts--This type of cyst is sometimes found in both ovaries. These cysts may contain different kinds of body tissue, such as skin, teeth, hair, or cartilage. They usually do not cause symptoms unless they get very big.  Theca lutein cysts--These cysts occur when too much of a certain hormone (human chorionic gonadotropin) is produced and overstimulates the ovaries to produce an egg. This is most common after procedures used to assist with the conception of a baby (in vitro fertilization). CAUSES   Fertility drugs can cause a condition in which multiple large cysts are formed on the ovaries. This is called ovarian hyperstimulation syndrome.  A condition called polycystic ovary syndrome can cause hormonal imbalances that can lead to  nonfunctional ovarian cysts. SIGNS AND SYMPTOMS  Many ovarian cysts do not cause symptoms. If symptoms are present, they may include:  Pelvic pain or pressure.  Pain in the lower abdomen.  Pain during sexual intercourse.  Increasing girth (swelling) of the abdomen.  Abnormal menstrual periods.  Increasing pain with menstrual periods.  Stopping having menstrual periods without being pregnant. DIAGNOSIS  These cysts are commonly found during a routine or annual pelvic exam. Tests may be ordered to find out more about the cyst. These tests may include:  Ultrasound.  X-ray of the pelvis.  CT scan.  MRI.  Blood tests. TREATMENT  Many ovarian cysts go away on their own without treatment. Your health care provider may want to check your cyst regularly for 2-3 months to see if it changes. For women in menopause, it is particularly important to monitor a cyst closely because of the higher rate of ovarian cancer in menopausal women. When treatment is needed, it may include any of the following:  A procedure to drain the cyst (aspiration). This may be done using a long needle and ultrasound. It can also be done through a laparoscopic procedure. This involves using a thin, lighted tube with a tiny camera on the end (laparoscope) inserted through a small incision.  Surgery to remove the whole cyst. This may be done using laparoscopic surgery or an open surgery involving a larger incision in the lower abdomen.  Hormone treatment or birth control pills. These methods are sometimes used to help dissolve a cyst. HOME CARE INSTRUCTIONS   Only take over-the-counter   or prescription medicines as directed by your health care provider.  Follow up with your health care provider as directed.  Get regular pelvic exams and Pap tests. SEEK MEDICAL CARE IF:   Your periods are late, irregular, or painful, or they stop.  Your pelvic pain or abdominal pain does not go away.  Your abdomen becomes  larger or swollen.  You have pressure on your bladder or trouble emptying your bladder completely.  You have pain during sexual intercourse.  You have feelings of fullness, pressure, or discomfort in your stomach.  You lose weight for no apparent reason.  You feel generally ill.  You become constipated.  You lose your appetite.  You develop acne.  You have an increase in body and facial hair.  You are gaining weight, without changing your exercise and eating habits.  You think you are pregnant. SEEK IMMEDIATE MEDICAL CARE IF:   You have increasing abdominal pain.  You feel sick to your stomach (nauseous), and you throw up (vomit).  You develop a fever that comes on suddenly.  You have abdominal pain during a bowel movement.  Your menstrual periods become heavier than usual. MAKE SURE YOU:  Understand these instructions.  Will watch your condition.  Will get help right away if you are not doing well or get worse. Document Released: 07/14/2005 Document Revised: 07/19/2013 Document Reviewed: 03/21/2013 [employer] Patient Information 2015 [employer]. This information is not intended to replace advice given to you by your health care provider. Make sure you discuss any questions you have with your health care provider. CA-125 Tumor Marker CA 125 is a tumor marker that is used to help monitor the course of ovarian or endometrial cancer. PREPARATION FOR TEST No preparation is necessary. NORMAL FINDINGS Adults: 0-35 units/mL (0-35 kilounits)/L Ranges for normal findings may vary among different laboratories and hospitals. You should always check with your doctor after having lab work or other tests done to discuss the meaning of your test results and whether your values are considered within normal limits. MEANING OF TEST  Your caregiver will go over the test results with you and discuss the importance and meaning of your results, as well as treatment options and the  need for additional tests if necessary. OBTAINING THE TEST RESULTS It is your responsibility to obtain your test results. Ask the lab or department performing the test when and how you will get your results. Document Released: 08/05/2004 Document Revised: 10/06/2011 Document Reviewed: 06/21/2008 [employer] Patient Information 2015 [employer]. This information is not intended to replace advice given to you by your health care provider. Make sure you discuss any questions you have with your health care provider.  

## 2014-04-12 NOTE — Progress Notes (Signed)
    The patient presented to the office today for an ultrasound. Patient was seen in the office on September 1 her annual gynecological examination see previous note for details. During that examination she was found to have a right adnexal fullness on ultrasound was ordered today. Patient does have the Mirena IUD that was placed in 2013. Patient still some twinges on all the right lower quadrant. Patient in January of 2014 had a followup ultrasound for previous right ovarian cyst which had resolved.  Ultrasound: Uterus measuring 9.9 x 5.3 x 4.3 cm with endometrial stripe of 2.3 mm. IUD was visualized in normal position. A right ovarian thinwall ankle free cyst measuring 3.2 x 3.8 x 2.5 cm average size 3.2 cm was noted avascular. Left ovary was normal. Minimal fluid noted in the right adnexa.  Assessment/plan: Right ovarian cyst appears to be benign. We discussed the pros and cons of CA 125 and patient would like have the blood tests drawn today. We discussed his limitations. She will return back in 3 months for a followup ultrasound. She will take nonsteroidal anti-inflammatory when necessary.

## 2014-04-13 LAB — CA 125: CA 125: 21 U/mL (ref <35)

## 2014-05-29 ENCOUNTER — Encounter: Payer: Self-pay | Admitting: Gynecology

## 2014-07-12 ENCOUNTER — Other Ambulatory Visit: Payer: Self-pay | Admitting: Gynecology

## 2014-07-12 ENCOUNTER — Ambulatory Visit (INDEPENDENT_AMBULATORY_CARE_PROVIDER_SITE_OTHER): Payer: BC Managed Care – PPO

## 2014-07-12 ENCOUNTER — Ambulatory Visit (INDEPENDENT_AMBULATORY_CARE_PROVIDER_SITE_OTHER): Payer: BC Managed Care – PPO | Admitting: Gynecology

## 2014-07-12 DIAGNOSIS — N83202 Unspecified ovarian cyst, left side: Secondary | ICD-10-CM

## 2014-07-12 DIAGNOSIS — Z30432 Encounter for removal of intrauterine contraceptive device: Secondary | ICD-10-CM

## 2014-07-12 DIAGNOSIS — N8320 Unspecified ovarian cysts: Secondary | ICD-10-CM

## 2014-07-12 DIAGNOSIS — N83201 Unspecified ovarian cyst, right side: Secondary | ICD-10-CM

## 2014-07-12 NOTE — Patient Instructions (Signed)
Ovarian Cyst An ovarian cyst is a fluid-filled sac that forms on an ovary. The ovaries are small organs that produce eggs in women. Various types of cysts can form on the ovaries. Most are not cancerous. Many do not cause problems, and they often go away on their own. Some may cause symptoms and require treatment. Common types of ovarian cysts include:  Functional cysts--These cysts may occur every month during the menstrual cycle. This is normal. The cysts usually go away with the next menstrual cycle if the woman does not get pregnant. Usually, there are no symptoms with a functional cyst.  Endometrioma cysts--These cysts form from the tissue that lines the uterus. They are also called "chocolate cysts" because they become filled with blood that turns brown. This type of cyst can cause pain in the lower abdomen during intercourse and with your menstrual period.  Cystadenoma cysts--This type develops from the cells on the outside of the ovary. These cysts can get very big and cause lower abdomen pain and pain with intercourse. This type of cyst can twist on itself, cut off its blood supply, and cause severe pain. It can also easily rupture and cause a lot of pain.  Dermoid cysts--This type of cyst is sometimes found in both ovaries. These cysts may contain different kinds of body tissue, such as skin, teeth, hair, or cartilage. They usually do not cause symptoms unless they get very big.  Theca lutein cysts--These cysts occur when too much of a certain hormone (human chorionic gonadotropin) is produced and overstimulates the ovaries to produce an egg. This is most common after procedures used to assist with the conception of a baby (in vitro fertilization). CAUSES   Fertility drugs can cause a condition in which multiple large cysts are formed on the ovaries. This is called ovarian hyperstimulation syndrome.  A condition called polycystic ovary syndrome can cause hormonal imbalances that can lead to  nonfunctional ovarian cysts. SIGNS AND SYMPTOMS  Many ovarian cysts do not cause symptoms. If symptoms are present, they may include:  Pelvic pain or pressure.  Pain in the lower abdomen.  Pain during sexual intercourse.  Increasing girth (swelling) of the abdomen.  Abnormal menstrual periods.  Increasing pain with menstrual periods.  Stopping having menstrual periods without being pregnant. DIAGNOSIS  These cysts are commonly found during a routine or annual pelvic exam. Tests may be ordered to find out more about the cyst. These tests may include:  Ultrasound.  X-ray of the pelvis.  CT scan.  MRI.  Blood tests. TREATMENT  Many ovarian cysts go away on their own without treatment. Your health care provider may want to check your cyst regularly for 2-3 months to see if it changes. For women in menopause, it is particularly important to monitor a cyst closely because of the higher rate of ovarian cancer in menopausal women. When treatment is needed, it may include any of the following:  A procedure to drain the cyst (aspiration). This may be done using a long needle and ultrasound. It can also be done through a laparoscopic procedure. This involves using a thin, lighted tube with a tiny camera on the end (laparoscope) inserted through a small incision.  Surgery to remove the whole cyst. This may be done using laparoscopic surgery or an open surgery involving a larger incision in the lower abdomen.  Hormone treatment or birth control pills. These methods are sometimes used to help dissolve a cyst. HOME CARE INSTRUCTIONS   Only take over-the-counter   or prescription medicines as directed by your health care provider.  Follow up with your health care provider as directed.  Get regular pelvic exams and Pap tests. SEEK MEDICAL CARE IF:   Your periods are late, irregular, or painful, or they stop.  Your pelvic pain or abdominal pain does not go away.  Your abdomen becomes  larger or swollen.  You have pressure on your bladder or trouble emptying your bladder completely.  You have pain during sexual intercourse.  You have feelings of fullness, pressure, or discomfort in your stomach.  You lose weight for no apparent reason.  You feel generally ill.  You become constipated.  You lose your appetite.  You develop acne.  You have an increase in body and facial hair.  You are gaining weight, without changing your exercise and eating habits.  You think you are pregnant. SEEK IMMEDIATE MEDICAL CARE IF:   You have increasing abdominal pain.  You feel sick to your stomach (nauseous), and you throw up (vomit).  You develop a fever that comes on suddenly.  You have abdominal pain during a bowel movement.  Your menstrual periods become heavier than usual. MAKE SURE YOU:  Understand these instructions.  Will watch your condition.  Will get help right away if you are not doing well or get worse. Document Released: 07/14/2005 Document Revised: 07/19/2013 Document Reviewed: 03/21/2013 [employer] Patient Information 2015 [employer]. This information is not intended to replace advice given to you by your health care provider. Make sure you discuss any questions you have with your health care provider.  

## 2014-07-12 NOTE — Progress Notes (Signed)
   Patient presented to the office today for 3 month follow-up on a right ovarian cyst. At time of her annual exam in September this year she was found to have an adnexal fullness and an ultrasound had been done which demonstrated the following:  Ultrasound: Uterus measuring 9.9 x 5.3 x 4.3 cm with endometrial stripe of 2.3 mm. IUD was visualized in normal position. A right ovarian thinwall echo free cyst measuring 3.2 x 3.8 x 2.5 cm average size 3.2 cm was noted avascular. Left ovary was normal. Minimal fluid noted in the right adnexa.  Patient had a normal C1 25 sphere today for follow-up ultrasound. Patient was also wishing to have her IUD removed because she's having cyst since she had a Mirena IUD placed. Patient not interested in any contraception.  Ultrasound: Uterus measured 9.9 x 5.6 x 4.4 cm with endometrial stripe of 4.5 mm. IUD had been seen in the normal position. Right ovary normal previous cyst not seen. Now on the left ovary doesn't thinwall echo-free cyst which is avascular with an average size of 4.7 cm negative color flow.  As per patient's request her cervix was cleansed with Betadine solution and with the use of a [place] clamp the IUD string was grasped retrieved shown to the patient discarded.  Assessment/plan: History of recurrent ovarian cysts alternating sides over the past several months with Mirena IUD. Patient requested that it be removed and was done so today. Patient now has a contralateral ovarian simple appearing cyst. She will return back in 4 months for follow-up ultrasound now that she is off any form of hormone. I did recommend that she take prenatal vitamin daily for neural tube prophylaxis in the event that she were to conceive.

## 2014-08-23 ENCOUNTER — Encounter: Payer: Self-pay | Admitting: Gynecology

## 2014-08-23 ENCOUNTER — Ambulatory Visit (INDEPENDENT_AMBULATORY_CARE_PROVIDER_SITE_OTHER): Payer: BC Managed Care – PPO | Admitting: Gynecology

## 2014-08-23 VITALS — BP 146/88

## 2014-08-23 DIAGNOSIS — I10 Essential (primary) hypertension: Secondary | ICD-10-CM

## 2014-08-23 DIAGNOSIS — R102 Pelvic and perineal pain: Secondary | ICD-10-CM

## 2014-08-23 DIAGNOSIS — Z833 Family history of diabetes mellitus: Secondary | ICD-10-CM

## 2014-08-23 DIAGNOSIS — N83202 Unspecified ovarian cyst, left side: Secondary | ICD-10-CM

## 2014-08-23 DIAGNOSIS — N8320 Unspecified ovarian cysts: Secondary | ICD-10-CM

## 2014-08-23 DIAGNOSIS — N912 Amenorrhea, unspecified: Secondary | ICD-10-CM

## 2014-08-23 LAB — URINALYSIS W MICROSCOPIC + REFLEX CULTURE
Bilirubin Urine: NEGATIVE
Casts: NONE SEEN
Crystals: NONE SEEN
Glucose, UA: NEGATIVE mg/dL
Ketones, ur: NEGATIVE mg/dL
Nitrite: NEGATIVE
Protein, ur: NEGATIVE mg/dL
Specific Gravity, Urine: 1.01 (ref 1.005–1.030)
Urobilinogen, UA: 0.2 mg/dL (ref 0.0–1.0)
pH: 6 (ref 5.0–8.0)

## 2014-08-23 LAB — HEMOGLOBIN A1C
Hgb A1c MFr Bld: 5.3 % (ref <5.7)
Mean Plasma Glucose: 105 mg/dL (ref <117)

## 2014-08-23 LAB — PREGNANCY, URINE: Preg Test, Ur: NEGATIVE

## 2014-08-23 MED ORDER — FLUCONAZOLE 100 MG PO TABS: ORAL_TABLET | ORAL | Status: DC

## 2014-08-23 MED ORDER — MEDROXYPROGESTERONE ACETATE 10 MG PO TABS
ORAL_TABLET | ORAL | Status: DC
Start: 2014-08-23 — End: 2015-01-02

## 2014-08-23 NOTE — Patient Instructions (Signed)
Ovarian Cyst An ovarian cyst is a fluid-filled sac that forms on an ovary. The ovaries are small organs that produce eggs in women. Various types of cysts can form on the ovaries. Most are not cancerous. Many do not cause problems, and they often go away on their own. Some may cause symptoms and require treatment. Common types of ovarian cysts include:  Functional cysts--These cysts may occur every month during the menstrual cycle. This is normal. The cysts usually go away with the next menstrual cycle if the woman does not get pregnant. Usually, there are no symptoms with a functional cyst.  Endometrioma cysts--These cysts form from the tissue that lines the uterus. They are also called "chocolate cysts" because they become filled with blood that turns brown. This type of cyst can cause pain in the lower abdomen during intercourse and with your menstrual period.  Cystadenoma cysts--This type develops from the cells on the outside of the ovary. These cysts can get very big and cause lower abdomen pain and pain with intercourse. This type of cyst can twist on itself, cut off its blood supply, and cause severe pain. It can also easily rupture and cause a lot of pain.  Dermoid cysts--This type of cyst is sometimes found in both ovaries. These cysts may contain different kinds of body tissue, such as skin, teeth, hair, or cartilage. They usually do not cause symptoms unless they get very big.  Theca lutein cysts--These cysts occur when too much of a certain hormone (human chorionic gonadotropin) is produced and overstimulates the ovaries to produce an egg. This is most common after procedures used to assist with the conception of a baby (in vitro fertilization). CAUSES   Fertility drugs can cause a condition in which multiple large cysts are formed on the ovaries. This is called ovarian hyperstimulation syndrome.  A condition called polycystic ovary syndrome can cause hormonal imbalances that can lead to  nonfunctional ovarian cysts. SIGNS AND SYMPTOMS  Many ovarian cysts do not cause symptoms. If symptoms are present, they may include:  Pelvic pain or pressure.  Pain in the lower abdomen.  Pain during sexual intercourse.  Increasing girth (swelling) of the abdomen.  Abnormal menstrual periods.  Increasing pain with menstrual periods.  Stopping having menstrual periods without being pregnant. DIAGNOSIS  These cysts are commonly found during a routine or annual pelvic exam. Tests may be ordered to find out more about the cyst. These tests may include:  Ultrasound.  X-ray of the pelvis.  CT scan.  MRI.  Blood tests. TREATMENT  Many ovarian cysts go away on their own without treatment. Your health care provider may want to check your cyst regularly for 2-3 months to see if it changes. For women in menopause, it is particularly important to monitor a cyst closely because of the higher rate of ovarian cancer in menopausal women. When treatment is needed, it may include any of the following:  A procedure to drain the cyst (aspiration). This may be done using a long needle and ultrasound. It can also be done through a laparoscopic procedure. This involves using a thin, lighted tube with a tiny camera on the end (laparoscope) inserted through a small incision.  Surgery to remove the whole cyst. This may be done using laparoscopic surgery or an open surgery involving a larger incision in the lower abdomen.  Hormone treatment or birth control pills. These methods are sometimes used to help dissolve a cyst. HOME CARE INSTRUCTIONS   Only take over-the-counter   or prescription medicines as directed by your health care provider.  Follow up with your health care provider as directed.  Get regular pelvic exams and Pap tests. SEEK MEDICAL CARE IF:   Your periods are late, irregular, or painful, or they stop.  Your pelvic pain or abdominal pain does not go away.  Your abdomen becomes  larger or swollen.  You have pressure on your bladder or trouble emptying your bladder completely.  You have pain during sexual intercourse.  You have feelings of fullness, pressure, or discomfort in your stomach.  You lose weight for no apparent reason.  You feel generally ill.  You become constipated.  You lose your appetite.  You develop acne.  You have an increase in body and facial hair.  You are gaining weight, without changing your exercise and eating habits.  You think you are pregnant. SEEK IMMEDIATE MEDICAL CARE IF:   You have increasing abdominal pain.  You feel sick to your stomach (nauseous), and you throw up (vomit).  You develop a fever that comes on suddenly.  You have abdominal pain during a bowel movement.  Your menstrual periods become heavier than usual. MAKE SURE YOU:  Understand these instructions.  Will watch your condition.  Will get help right away if you are not doing well or get worse. Document Released: 07/14/2005 Document Revised: 07/19/2013 Document Reviewed: 03/21/2013 [facility] Patient Information 2015 [place], [place]. This information is not intended to replace advice given to you by your health care provider. Make sure you discuss any questions you have with your health care provider. Hypertension Hypertension, commonly called high blood pressure, is when the force of blood pumping through your arteries is too strong. Your arteries are the blood vessels that carry blood from your heart throughout your body. A blood pressure reading consists of a higher number over a lower number, such as 110/72. The higher number (systolic) is the pressure inside your arteries when your heart pumps. The lower number (diastolic) is the pressure inside your arteries when your heart relaxes. Ideally you want your blood pressure below 120/80. Hypertension forces your heart to work harder to pump blood. Your arteries may become narrow or stiff. Having  hypertension puts you at risk for heart disease, stroke, and other problems.  RISK FACTORS Some risk factors for high blood pressure are controllable. Others are not.  Risk factors you cannot control include:   Race. You may be at higher risk if you are African American.  Age. Risk increases with age.  Gender. Men are at higher risk than women before age 7 years. After age 27, women are at higher risk than men. Risk factors you can control include:  Not getting enough exercise or physical activity.  Being overweight.  Getting too much fat, sugar, calories, or salt in your diet.  Drinking too much alcohol. SIGNS AND SYMPTOMS Hypertension does not usually cause signs or symptoms. Extremely high blood pressure (hypertensive crisis) may cause headache, anxiety, shortness of breath, and nosebleed. DIAGNOSIS  To check if you have hypertension, your health care provider will measure your blood pressure while you are seated, with your arm held at the level of your heart. It should be measured at least twice using the same arm. Certain conditions can cause a difference in blood pressure between your right and left arms. A blood pressure reading that is higher than normal on one occasion does not mean that you need treatment. If one blood pressure reading is high, ask your health  care provider about having it checked again. TREATMENT  Treating high blood pressure includes making lifestyle changes and possibly taking medicine. Living a healthy lifestyle can help lower high blood pressure. You may need to change some of your habits. Lifestyle changes may include:  Following the DASH diet. This diet is high in fruits, vegetables, and whole grains. It is low in salt, red meat, and added sugars.  Getting at least 2 hours of brisk physical activity every week.  Losing weight if necessary.  Not smoking.  Limiting alcoholic beverages.  Learning ways to reduce stress. If lifestyle changes are  not enough to get your blood pressure under control, your health care provider may prescribe medicine. You may need to take more than one. Work closely with your health care provider to understand the risks and benefits. HOME CARE INSTRUCTIONS  Have your blood pressure rechecked as directed by your health care provider.   Take medicines only as directed by your health care provider. Follow the directions carefully. Blood pressure medicines must be taken as prescribed. The medicine does not work as well when you skip doses. Skipping doses also puts you at risk for problems.   Do not smoke.   Monitor your blood pressure at home as directed by your health care provider. SEEK MEDICAL CARE IF:   You think you are having a reaction to medicines taken.  You have recurrent headaches or feel dizzy.  You have swelling in your ankles.  You have trouble with your vision. SEEK IMMEDIATE MEDICAL CARE IF:  You develop a severe headache or confusion.  You have unusual weakness, numbness, or feel faint.  You have severe chest or abdominal pain.  You vomit repeatedly.  You have trouble breathing. MAKE SURE YOU:   Understand these instructions.  Will watch your condition.  Will get help right away if you are not doing well or get worse. Document Released: 07/14/2005 Document Revised: 11/28/2013 Document Reviewed: 05/06/2013 [facility] Patient Information 2015 [place]. This information is not intended to replace advice given to you by your health care provider. Make sure you discuss any questions you have with your health care provider.

## 2014-08-23 NOTE — Progress Notes (Signed)
   Patient presented to the office today complaining of amenorrhea since her IUD was removed on December 2016. Patient no longer wanted to use any form of contraception since with a Mirena IUD she periodically was getting cyst on either side. Her last ultrasound had demonstrated the following:  Uterus measured 9.9 x 5.6 x 4.4 cm with endometrial stripe of 4.5 mm. IUD had been seen in the normal position. Right ovary normal previous cyst not seen. Now on the left ovary doesn't thinwall echo-free cyst which is avascular with an average size of 4.7 cm negative color flow.  She was complaining of some breast tenderness and some low abdominal discomfort but no dysuria per se or any unusual discharge. Her blood pressure when she came in initially demonstrated a reading of 146/88 but on repeat it was 130/78. Patient has denied any visual disturbances or nipple discharge on low some occasional headaches were reported. She does have a strong family history of diabetes in her family.  Exam: HEENT: Unremarkable Neck: Supple trachea midline no carotid bruits or thyromegaly Lungs: Clear to auscultation Rogers or wheezes Heart: Regular rate and rhythm no murmurs or gallops Abdomen: Soft nontender no rebound or guarding Pelvic: Bartholin urethra Skene was within normal limits Vagina: No lesions or discharge Cervix: No lesions or discharge Uterus: Anteverted normal size shape and consistency Slight fullness in the left adnexa from previously seen left ovarian cyst a few weeks ago Rectal exam: Not done  Urine pregnancy test negative  Urinalysis: WBC 3-6, bacteria few, and some yeast submitted for culture  Assessment/plan: Secondary amenorrhea. We'll check TSH and prolactin as well as a qualitative beta-hCG of all these tests are normal patient will be prescribed Provera 10 mg to take 1 by mouth daily for 10 days to start her menses. Her symptoms may be attributed from not having menstruated. I've given her  additional prescription for Provera to take if she does not have a spontaneous menses every 35 days providing that she does a urine pregnancy test first. A prescription of Diflucan will be provided today as well. Because of her family history of diabetes a hemoglobin A1c will be drawn today as well. Patient to return in 4 months for follow-up ultrasound a left ovarian cyst.

## 2014-08-24 LAB — PROLACTIN: Prolactin: 16 ng/mL

## 2014-08-24 LAB — TSH: TSH: 1.843 u[IU]/mL (ref 0.350–4.500)

## 2014-08-24 LAB — HCG, SERUM, QUALITATIVE: Preg, Serum: NEGATIVE

## 2014-08-27 LAB — URINE CULTURE: Colony Count: 85000

## 2014-08-28 ENCOUNTER — Other Ambulatory Visit: Payer: Self-pay | Admitting: Gynecology

## 2014-08-28 ENCOUNTER — Telehealth: Payer: Self-pay

## 2014-08-28 MED ORDER — PENICILLIN V POTASSIUM 250 MG PO TABS: 250.0000 mg | ORAL_TABLET | Freq: Two times a day (BID) | ORAL | Status: DC

## 2014-08-28 NOTE — Telephone Encounter (Signed)
Patient wanted me to let you know that she did not have to use the Provera as her menses began on it's own without it.

## 2014-08-28 NOTE — Progress Notes (Signed)
I just put the order in. I took care of it  thank you

## 2014-09-07 ENCOUNTER — Other Ambulatory Visit: Payer: Self-pay | Admitting: Gynecology

## 2014-09-07 ENCOUNTER — Other Ambulatory Visit: Payer: Self-pay

## 2014-09-07 MED ORDER — PENICILLIN V POTASSIUM 250 MG PO TABS: 250.0000 mg | ORAL_TABLET | Freq: Two times a day (BID) | ORAL | Status: DC

## 2015-01-02 ENCOUNTER — Encounter: Payer: Self-pay | Admitting: Gynecology

## 2015-01-02 ENCOUNTER — Ambulatory Visit (INDEPENDENT_AMBULATORY_CARE_PROVIDER_SITE_OTHER): Payer: BC Managed Care – PPO | Admitting: Gynecology

## 2015-01-02 VITALS — BP 130/74

## 2015-01-02 DIAGNOSIS — R350 Frequency of micturition: Secondary | ICD-10-CM

## 2015-01-02 DIAGNOSIS — R110 Nausea: Secondary | ICD-10-CM | POA: Diagnosis not present

## 2015-01-02 DIAGNOSIS — N8320 Unspecified ovarian cysts: Secondary | ICD-10-CM

## 2015-01-02 DIAGNOSIS — N926 Irregular menstruation, unspecified: Secondary | ICD-10-CM | POA: Diagnosis not present

## 2015-01-02 DIAGNOSIS — N83202 Unspecified ovarian cyst, left side: Secondary | ICD-10-CM

## 2015-01-02 LAB — CBC WITH DIFFERENTIAL/PLATELET
Basophils Absolute: 0 10*3/uL (ref 0.0–0.1)
Basophils Relative: 0 % (ref 0–1)
Eosinophils Absolute: 0 10*3/uL (ref 0.0–0.7)
Eosinophils Relative: 1 % (ref 0–5)
HCT: 34.8 % — ABNORMAL LOW (ref 36.0–46.0)
Hemoglobin: 11.5 g/dL — ABNORMAL LOW (ref 12.0–15.0)
Lymphocytes Relative: 45 % (ref 12–46)
Lymphs Abs: 2 10*3/uL (ref 0.7–4.0)
MCH: 31.8 pg (ref 26.0–34.0)
MCHC: 33 g/dL (ref 30.0–36.0)
MCV: 96.1 fL (ref 78.0–100.0)
MPV: 10.5 fL (ref 8.6–12.4)
Monocytes Absolute: 0.4 10*3/uL (ref 0.1–1.0)
Monocytes Relative: 9 % (ref 3–12)
Neutro Abs: 2 10*3/uL (ref 1.7–7.7)
Neutrophils Relative %: 45 % (ref 43–77)
Platelets: 233 10*3/uL (ref 150–400)
RBC: 3.62 MIL/uL — ABNORMAL LOW (ref 3.87–5.11)
RDW: 13 % (ref 11.5–15.5)
WBC: 4.4 10*3/uL (ref 4.0–10.5)

## 2015-01-02 LAB — COMPREHENSIVE METABOLIC PANEL
ALT: 15 U/L (ref 0–35)
AST: 18 U/L (ref 0–37)
Albumin: 4.3 g/dL (ref 3.5–5.2)
Alkaline Phosphatase: 41 U/L (ref 39–117)
BUN: 6 mg/dL (ref 6–23)
CO2: 26 mEq/L (ref 19–32)
Calcium: 9 mg/dL (ref 8.4–10.5)
Chloride: 103 mEq/L (ref 96–112)
Creat: 0.64 mg/dL (ref 0.50–1.10)
Glucose, Bld: 80 mg/dL (ref 70–99)
Potassium: 3.6 mEq/L (ref 3.5–5.3)
Sodium: 136 mEq/L (ref 135–145)
Total Bilirubin: 0.8 mg/dL (ref 0.2–1.2)
Total Protein: 7.1 g/dL (ref 6.0–8.3)

## 2015-01-02 LAB — URINALYSIS W MICROSCOPIC + REFLEX CULTURE
Bilirubin Urine: NEGATIVE
Glucose, UA: NEGATIVE mg/dL
Hgb urine dipstick: NEGATIVE
Ketones, ur: NEGATIVE mg/dL
Leukocytes, UA: NEGATIVE
Nitrite: NEGATIVE
Specific Gravity, Urine: 1.015 (ref 1.005–1.030)
Urobilinogen, UA: 2 mg/dL — ABNORMAL HIGH (ref 0.0–1.0)
pH: 7 (ref 5.0–8.0)

## 2015-01-02 LAB — HEMOGLOBIN A1C
Hgb A1c MFr Bld: 5.3 % (ref <5.7)
Mean Plasma Glucose: 105 mg/dL (ref <117)

## 2015-01-02 LAB — PREGNANCY, URINE: Preg Test, Ur: NEGATIVE

## 2015-01-02 NOTE — Progress Notes (Signed)
   Patient is a 39 year old who presented to the office today stating that she's not been feeling well the past few days. She's not been using any form of contraception and reports her last normal menstrual period of April 27 which typically last 5-7 days but then on May 27 she applied for only 3 days and passing blood clots the first few days which was atypical for her. She's had some nausea and some abdominal bloating. Patient denies any intermenstrual bleeding. 3 weeks ago she was complaining of frequency in urination but no dysuria. She does have family history of diabetes. She did several urine principal hasn't home which were negative. Review of her record indicated that she was last seen on 07/12/2014 and she had presented for follow-up ultrasound previously seen ovarian cyst on prior ultrasound. The ultrasound from that day demonstrated the following:  Uterus measured 9.9 x 5.6 x 4.4 cm with endometrial stripe of 4.5 mm. IUD had been seen in the normal position. Right ovary normal previous cyst not seen. Now on the left ovary there was a thinwall echo-free cyst which is avascular with an average size of 4.7 cm negative color flow patient had her IUD removed at that office visit as well. CA 125 last year 21.   Exam: Blood pressure 130/74 Gen. appearance well-developed well-nourished female with the above mentioned complaint no acute distress today. Cardiac exam: Heart regular rate and rhythm no murmurs or gallops On eye exam there was no evidence of icterus sclera Abdomen: Soft nontender no rebound or guarding Pelvic: Bartholin urethra Skene was within normal limits Vagina: No lesions or discharge Cervix: No lesions or discharge Uterus: Anteverted normal size shape and consistency Adnexa: Right no palpable masses or tenderness Left adnexa slightly enlarged less than ovary?  Urine proceed test negative  Urinalysis negative  Assessment/plan: Patient with atypical menstrual cycle we'll  check a qualitative beta hCG despite negative urine pregnancy test. Because of her family history of diabetes were going to check a hemoglobin A1c along with her conference metabolic panel and CBC today. Her urine will be sent for culture. Patient will return back next week for an ultrasound for follow-up on her left ovarian cyst. Patient will continue her prenatal vitamins.

## 2015-01-02 NOTE — Patient Instructions (Signed)
Transvaginal Ultrasound Transvaginal ultrasound is a pelvic ultrasound, using a metal probe that is placed in the vagina, to look at a women's female organs. Transvaginal ultrasound is a method of seeing inside the pelvis of a woman. The ultrasound machine sends out sound waves from the transducer (probe). These sound waves bounce off body structures (like an echo) to create a picture. The picture shows up on a monitor. It is called transvaginal because the probe is inserted into the vagina. There should be very little discomfort from the vaginal probe. This test can also be used during pregnancy. Endovaginal ultrasound is another name for a transvaginal ultrasound. In a transabdominal ultrasound, the probe is placed on the outside of the belly. This method gives pictures that are lower quality than pictures from the transvaginal technique. Transvaginal ultrasound is used to look for problems of the female genital tract. Some such problems include:  Infertility problems.  Congenital (birth defect) malformations of the uterus and ovaries.  Tumors in the uterus.  Abnormal bleeding.  Ovarian tumors and cysts.  Abscess (inflamed tissue around pus) in the pelvis.  Unexplained abdominal or pelvic pain.  Pelvic infection. DURING PREGNANCY, TRANSVAGINAL ULTRASOUND MAY BE USED TO LOOK AT:  Normal pregnancy.  Ectopic pregnancy (pregnancy outside the uterus).  Fetal heartbeat.  Abnormalities in the pelvis, that are not seen well with transabdominal ultrasound.  Suspected twins or multiples.  Impending miscarriage.  Problems with the cervix (incompetent cervix, not able to stay closed and hold the baby).  When doing an amniocentesis (removing fluid from the pregnancy sac, for testing).  Looking for abnormalities of the baby.  Checking the growth, development, and age of the fetus.  Measuring the amount of fluid in the amniotic sac.  When doing an external version of the baby (moving  baby into correct position).  Evaluating the baby for problems in high risk pregnancies (biophysical profile).  Suspected fetal demise (death). Sometimes a special ultrasound method called Saline Infusion Sonography (SIS) is used for a more accurate look at the uterus. Sterile saline (salt water) is injected into the uterus of non-pregnant patients to see the inside of the uterus better. SIS is not used on pregnant women. The vaginal probe can also assist in obtaining biopsies of abnormal areas, in draining fluid from cysts on the ovary, and in finding IUDs (intrauterine device, birth control) that cannot be located. PREPARATION FOR TEST A transvaginal ultrasound is done with the bladder empty. The transabdominal ultrasound is done with your bladder full. You may be asked to drink several glasses of water before that exam. Sometimes, a transabdominal ultrasound is done just after a transvaginal ultrasound, to look at organs in your abdomen. PROCEDURE  You will lie down on a table, with your knees bent and your feet in foot holders. The probe is covered with a condom. A sterile lubricant is put into the vagina and on the probe. The lubricant helps transmit the sound waves and avoid irritating the vagina. Your caregiver will move the probe inside the vaginal cavity to scan the pelvic structures. A normal test will show a normal pelvis and normal contents. An abnormal test will show abnormalities of the pelvis, placenta, or baby. ABNORMAL RESULTS MAY BE DUE TO:  Growths or tumors in the:  Uterus.  Ovaries.  Vagina.  Other pelvic structures.  Non-cancerous growths of the uterus and ovaries.  Twisting of the ovary, cutting off blood supply to the ovary (ovarian torsion).  Areas of infection, including:  Pelvic   inflammatory disease.  Abscess in the pelvis.  Locating an IUD. PROBLEMS FOUND IN PREGNANT WOMEN MAY INCLUDE:  Ectopic pregnancy (pregnancy outside the uterus).  Multiple  pregnancies.  Early dilation (opening) of the cervix. This may indicate an incompetent cervix and early delivery.  Impending miscarriage.  Fetal death.  Problems with the placenta, including:  Placenta has grown over the opening of the womb (placenta previa).  Placenta has separated early in the womb (placental abruption).  Placenta grows into the muscle of the uterus (placenta accreta).  Tumors of pregnancy, including gestational trophoblastic disease. This is an abnormal pregnancy, with no fetus. The uterus is filled with many grape-like cysts that could sometimes be cancerous.  Incorrect position of the fetus (breech, vertex).  Intrauterine fetal growth retardation (IUGR) (poor growth in the womb).  Fetal abnormalities or infection. RISKS AND COMPLICATIONS There are no known risks to the ultrasound procedure. There is no X-ray used when doing an ultrasound. Document Released: 06/25/2004 Document Revised: 10/06/2011 Document Reviewed: 06/13/2009 ExitCare Patient Information 2015 [employer]. This information is not intended to replace advice given to you by your health care provider. Make sure you discuss any questions you have with your health care provider. Ovarian Cyst An ovarian cyst is a fluid-filled sac that forms on an ovary. The ovaries are small organs that produce eggs in women. Various types of cysts can form on the ovaries. Most are not cancerous. Many do not cause problems, and they often go away on their own. Some may cause symptoms and require treatment. Common types of ovarian cysts include:  Functional cysts--These cysts may occur every month during the menstrual cycle. This is normal. The cysts usually go away with the next menstrual cycle if the woman does not get pregnant. Usually, there are no symptoms with a functional cyst.  Endometrioma cysts--These cysts form from the tissue that lines the uterus. They are also called "chocolate cysts" because they  become filled with blood that turns brown. This type of cyst can cause pain in the lower abdomen during intercourse and with your menstrual period.  Cystadenoma cysts--This type develops from the cells on the outside of the ovary. These cysts can get very big and cause lower abdomen pain and pain with intercourse. This type of cyst can twist on itself, cut off its blood supply, and cause severe pain. It can also easily rupture and cause a lot of pain.  Dermoid cysts--This type of cyst is sometimes found in both ovaries. These cysts may contain different kinds of body tissue, such as skin, teeth, hair, or cartilage. They usually do not cause symptoms unless they get very big.  Theca lutein cysts--These cysts occur when too much of a certain hormone (human chorionic gonadotropin) is produced and overstimulates the ovaries to produce an egg. This is most common after procedures used to assist with the conception of a baby (in vitro fertilization). CAUSES   Fertility drugs can cause a condition in which multiple large cysts are formed on the ovaries. This is called ovarian hyperstimulation syndrome.  A condition called polycystic ovary syndrome can cause hormonal imbalances that can lead to nonfunctional ovarian cysts. SIGNS AND SYMPTOMS  Many ovarian cysts do not cause symptoms. If symptoms are present, they may include:  Pelvic pain or pressure.  Pain in the lower abdomen.  Pain during sexual intercourse.  Increasing girth (swelling) of the abdomen.  Abnormal menstrual periods.  Increasing pain with menstrual periods.  Stopping having menstrual periods without being pregnant.   DIAGNOSIS  These cysts are commonly found during a routine or annual pelvic exam. Tests may be ordered to find out more about the cyst. These tests may include:  Ultrasound.  X-ray of the pelvis.  CT scan.  MRI.  Blood tests. TREATMENT  Many ovarian cysts go away on their own without treatment. Your health  care provider may want to check your cyst regularly for 2-3 months to see if it changes. For women in menopause, it is particularly important to monitor a cyst closely because of the higher rate of ovarian cancer in menopausal women. When treatment is needed, it may include any of the following:  A procedure to drain the cyst (aspiration). This may be done using a long needle and ultrasound. It can also be done through a laparoscopic procedure. This involves using a thin, lighted tube with a tiny camera on the end (laparoscope) inserted through a small incision.  Surgery to remove the whole cyst. This may be done using laparoscopic surgery or an open surgery involving a larger incision in the lower abdomen.  Hormone treatment or birth control pills. These methods are sometimes used to help dissolve a cyst. HOME CARE INSTRUCTIONS   Only take over-the-counter or prescription medicines as directed by your health care provider.  Follow up with your health care provider as directed.  Get regular pelvic exams and Pap tests. SEEK MEDICAL CARE IF:   Your periods are late, irregular, or painful, or they stop.  Your pelvic pain or abdominal pain does not go away.  Your abdomen becomes larger or swollen.  You have pressure on your bladder or trouble emptying your bladder completely.  You have pain during sexual intercourse.  You have feelings of fullness, pressure, or discomfort in your stomach.  You lose weight for no apparent reason.  You feel generally ill.  You become constipated.  You lose your appetite.  You develop acne.  You have an increase in body and facial hair.  You are gaining weight, without changing your exercise and eating habits.  You think you are pregnant. SEEK IMMEDIATE MEDICAL CARE IF:   You have increasing abdominal pain.  You feel sick to your stomach (nauseous), and you throw up (vomit).  You develop a fever that comes on suddenly.  You have abdominal  pain during a bowel movement.  Your menstrual periods become heavier than usual. MAKE SURE YOU:  Understand these instructions.  Will watch your condition.  Will get help right away if you are not doing well or get worse. Document Released: 07/14/2005 Document Revised: 07/19/2013 Document Reviewed: 03/21/2013 ExitCare Patient Information 2015 [employer]. This information is not intended to replace advice given to you by your health care provider. Make sure you discuss any questions you have with your health care provider.  

## 2015-01-03 LAB — HCG, SERUM, QUALITATIVE: Preg, Serum: NEGATIVE

## 2015-01-05 ENCOUNTER — Ambulatory Visit: Payer: BC Managed Care – PPO | Admitting: Gynecology

## 2015-01-08 ENCOUNTER — Ambulatory Visit (INDEPENDENT_AMBULATORY_CARE_PROVIDER_SITE_OTHER): Payer: BC Managed Care – PPO | Admitting: Gynecology

## 2015-01-08 ENCOUNTER — Encounter: Payer: Self-pay | Admitting: Gynecology

## 2015-01-08 ENCOUNTER — Ambulatory Visit (INDEPENDENT_AMBULATORY_CARE_PROVIDER_SITE_OTHER): Payer: BC Managed Care – PPO

## 2015-01-08 VITALS — BP 128/84

## 2015-01-08 DIAGNOSIS — N83202 Unspecified ovarian cyst, left side: Secondary | ICD-10-CM

## 2015-01-08 DIAGNOSIS — N8320 Unspecified ovarian cysts: Secondary | ICD-10-CM

## 2015-01-08 NOTE — Progress Notes (Signed)
   Patient is a 39 year old who presented to the office today for her follow-up on a left ovarian cyst that was noted back in December when she had her Mirena IUD removed. Patient currently not using any form of contraception but is on prenatal vitamins in the event that she were to conceive. She had some mild achy sensation lower abdomen but no discernible pain like before. Her previous ultrasound demonstrated the following:  Uterus measured 9.9 x 5.6 x 4.4 cm with endometrial stripe of 4.5 mm. IUD had been seen in the normal position. Right ovary normal previous cyst not seen. Now on the left ovary there was a thinwall echo-free cyst which is avascular with an average size of 4.7 cm negative color flow patient had her IUD removed at that office visit as well. CA 125 last year 21. Her ultrasound today demonstrated the following: Uterus measured 11.4 x 5.7 x 4.6 cm endometrial stripe 6.5 mm. Right ovary was normal. Left ovary thick wall collapse corpus luteum cyst measuring 22 x 23 mm (last Mr. 12/23/2014) positive color flow in the periphery. There was some fluid in the cul-de-sac 43 x 37 mm. Previous left ovarian cyst from December 2015 not seen. No apparent adnexal masses noted.  Assessment/plan: Complete resolution of previously seen left ovarian cyst the remaining fluid will be resorbed patient to take ibuprofen when necessary. Patient otherwise scheduled to return back to the office in September for her annual exam or when necessary. Patient was reminded to continue her prenatal vitamins for neural tube prophylaxis since they are not using any form of contraception.

## 2015-04-13 ENCOUNTER — Telehealth: Payer: Self-pay | Admitting: *Deleted

## 2015-04-13 NOTE — Telephone Encounter (Signed)
Pt called c/o sore throat, heart burn, mouth burning at times. I advised pt to be seen at urgent care to been seen.

## 2015-05-02 ENCOUNTER — Encounter: Payer: Self-pay | Admitting: Gynecology

## 2015-05-02 ENCOUNTER — Ambulatory Visit (INDEPENDENT_AMBULATORY_CARE_PROVIDER_SITE_OTHER): Payer: BC Managed Care – PPO | Admitting: Gynecology

## 2015-05-02 VITALS — BP 156/90 | Wt 168.0 lb

## 2015-05-02 DIAGNOSIS — K219 Gastro-esophageal reflux disease without esophagitis: Secondary | ICD-10-CM | POA: Diagnosis not present

## 2015-05-02 DIAGNOSIS — Z01419 Encounter for gynecological examination (general) (routine) without abnormal findings: Secondary | ICD-10-CM

## 2015-05-02 MED ORDER — RANITIDINE HCL 300 MG PO TABS: ORAL_TABLET | ORAL | Status: DC

## 2015-05-02 NOTE — Patient Instructions (Signed)
Ranitidine effervescent tablets What is this medicine? RANITIDINE (ra NYE te deen) is a type of antihistamine that blocks the release of stomach acid. It is used to treat stomach or intestinal ulcers. It can relieve ulcer pain and discomfort, and the heartburn from acid reflux. This medicine may be used for other purposes; ask your health care provider or pharmacist if you have questions. What should I tell my health care provider before I take this medicine? They need to know if you have any of these conditions: -kidney disease -liver disease -phenylketonuria -porphyria -an unusual or allergic reaction to ranitidine, other medicines, foods, dyes, or preservatives -pregnant or trying to get pregnant -breast-feeding How should I use this medicine? Dissolve this medicine in 5 ml (1 teaspoonful) of water just before taking by mouth. After the dose is dissolved, swallow all of the liquid to get the entire dose. Tablets should not be chewed, swallowed whole, or dissolved on the tongue. Follow the directions on the prescription label. Take your medicine at regular intervals. Do not take it more often than directed. Do not stop taking except on your doctor's advice. Talk to your pediatrician regarding the use of this medicine in children. Special care may be needed. Overdosage: If you think you have taken too much of this medicine contact a poison control center or emergency room at once. NOTE: This medicine is only for you. Do not share this medicine with others. What if I miss a dose? If you miss a dose, take it as soon as you can. If it is almost time for your next dose, take only that dose. Do not take double or extra doses. What may interact with this medicine? -atazanavir -delavirdine -gefitinib -glipizide -ketoconazole -midazolam -procainamide -propantheline -triazolam -warfarin This list may not describe all possible interactions. Give your health care provider a list of all the  medicines, herbs, non-prescription drugs, or dietary supplements you use. Also tell them if you smoke, drink alcohol, or use illegal drugs. Some items may interact with your medicine. What should I watch for while using this medicine? Tell your doctor or health care professional if your condition does not start to get better or gets worse. You may need to take this medicine for several days before your symptoms get better. Finish the full course of tablets prescribed, even if you feel better. Do not smoke cigarettes or drink alcohol. These increase irritation in your stomach and can lengthen the time it will take for ulcers to heal. Cigarettes and alcohol can also make acid reflux or heartburn worse. If you get black, tarry stools or vomit up what looks like coffee grounds, call your doctor or health care professional at once. You may have a bleeding ulcer. What side effects may I notice from receiving this medicine? Side effects that you should report to your doctor or health care professional as soon as possible: -agitation, nervousness, depression, hallucinations -allergic reactions like skin rash, itching or hives, swelling of the face, lips, or tongue -breast enlargement in both males and females -breathing problems -redness, blistering, peeling or loosening of the skin, including inside the mouth -unusual bleeding or bruising -unusually weak or tired -vomiting -yellowing of the skin or eyes Side effects that usually do not require medical attention (report to your doctor or health care professional if they continue or are bothersome): -constipation or diarrhea -dizziness -headache -nausea This list may not describe all possible side effects. Call your doctor for medical advice about side effects. You may report side  effects to FDA at 1-800-FDA-1088. Where should I keep my medicine? Keep out of the reach of children. Store at room temperature between 2 and 30 degrees C (36 and 86 degrees  F). Protect from light and moisture. Keep container tightly closed. Throw away any unused medicine after the expiration date. NOTE: This sheet is a summary. It may not cover all possible information. If you have questions about this medicine, talk to your doctor, pharmacist, or health care provider.    2016, Elsevier/Gold Standard. (2012-11-03 14:49:54) Gastroesophageal Reflux Disease, Adult Normally, food travels down the esophagus and stays in the stomach to be digested. However, when a person has gastroesophageal reflux disease (GERD), food and stomach acid move back up into the esophagus. When this happens, the esophagus becomes sore and inflamed. Over time, GERD can create small holes (ulcers) in the lining of the esophagus.  CAUSES This condition is caused by a problem with the muscle between the esophagus and the stomach (lower esophageal sphincter, or LES). Normally, the LES muscle closes after food passes through the esophagus to the stomach. When the LES is weakened or abnormal, it does not close properly, and that allows food and stomach acid to go back up into the esophagus. The LES can be weakened by certain dietary substances, medicines, and medical conditions, including:  Tobacco use.  Pregnancy.  Having a hiatal hernia.  Heavy alcohol use.  Certain foods and beverages, such as coffee, chocolate, onions, and peppermint. RISK FACTORS This condition is more likely to develop in:  People who have an increased body weight.  People who have connective tissue disorders.  People who use NSAID medicines. SYMPTOMS Symptoms of this condition include:  Heartburn.  Difficult or painful swallowing.  The feeling of having a lump in the throat.  Abitter taste in the mouth.  Bad breath.  Having a large amount of saliva.  Having an upset or bloated stomach.  Belching.  Chest pain.  Shortness of breath or wheezing.  Ongoing (chronic) cough or a night-time  cough.  Wearing away of tooth enamel.  Weight loss. Different conditions can cause chest pain. Make sure to see your health care provider if you experience chest pain. DIAGNOSIS Your health care provider will take a medical history and perform a physical exam. To determine if you have mild or severe GERD, your health care provider may also monitor how you respond to treatment. You may also have other tests, including:  An endoscopy toexamine your stomach and esophagus with a small camera.  A test thatmeasures the acidity level in your esophagus.  A test thatmeasures how much pressure is on your esophagus.  A barium swallow or modified barium swallow to show the shape, size, and functioning of your esophagus. TREATMENT The goal of treatment is to help relieve your symptoms and to prevent complications. Treatment for this condition may vary depending on how severe your symptoms are. Your health care provider may recommend:  Changes to your diet.  Medicine.  Surgery. HOME CARE INSTRUCTIONS Diet  Follow a diet as recommended by your health care provider. This may involve avoiding foods and drinks such as:  Coffee and tea (with or without caffeine).  Drinks that containalcohol.  Energy drinks and sports drinks.  Carbonated drinks or sodas.  Chocolate and cocoa.  Peppermint and mint flavorings.  Garlic and onions.  Horseradish.  Spicy and acidic foods, including peppers, chili powder, curry powder, vinegar, hot sauces, and barbecue sauce.  Citrus fruit juices and citrus fruits,  such as oranges, lemons, and limes.  Tomato-based foods, such as red sauce, chili, salsa, and pizza with red sauce.  Fried and fatty foods, such as donuts, french fries, potato chips, and high-fat dressings.  High-fat meats, such as hot dogs and fatty cuts of red and white meats, such as rib eye steak, sausage, ham, and bacon.  High-fat dairy items, such as whole milk, butter, and cream  cheese.  Eat small, frequent meals instead of large meals.  Avoid drinking large amounts of liquid with your meals.  Avoid eating meals during the 2-3 hours before bedtime.  Avoid lying down right after you eat.  Do not exercise right after you eat. General Instructions  Pay attention to any changes in your symptoms.  Take over-the-counter and prescription medicines only as told by your health care provider. Do not take aspirin, ibuprofen, or other NSAIDs unless your health care provider told you to do so.  Do not use any tobacco products, including cigarettes, chewing tobacco, and e-cigarettes. If you need help quitting, ask your health care provider.  Wear loose-fitting clothing. Do not wear anything tight around your waist that causes pressure on your abdomen.  Raise (elevate) the head of your bed 6 inches (15cm).  Try to reduce your stress, such as with yoga or meditation. If you need help reducing stress, ask your health care provider.  If you are overweight, reduce your weight to an amount that is healthy for you. Ask your health care provider for guidance about a safe weight loss goal.  Keep all follow-up visits as told by your health care provider. This is important. SEEK MEDICAL CARE IF:  You have new symptoms.  You have unexplained weight loss.  You have difficulty swallowing, or it hurts to swallow.  You have wheezing or a persistent cough.  Your symptoms do not improve with treatment.  You have a hoarse voice. SEEK IMMEDIATE MEDICAL CARE IF:  You have pain in your arms, neck, jaw, teeth, or back.  You feel sweaty, dizzy, or light-headed.  You have chest pain or shortness of breath.  You vomit and your vomit looks like blood or coffee grounds.  You faint.  Your stool is bloody or black.  You cannot swallow, drink, or eat.   This information is not intended to replace advice given to you by your health care provider. Make sure you discuss any  questions you have with your health care provider.   Document Released: 04/23/2005 Document Revised: 04/04/2015 Document Reviewed: 11/08/2014 Elsevier Interactive Patient Education [employer]

## 2015-05-02 NOTE — Progress Notes (Signed)
Sabrina [name] [birth year] [id number]   History:    39 y.o.  for annual gyn exam  Who is been complaining of esophageal reflux metallic taste in her mouth an heartburn or whether without meals throughout the day. She has noted no change in her stool color. No change in appetite. She is trying to get pregnant now and is currently on prenatal vitamins. Patient with past history of ovarian cysts last ultrasound June of this year with resolution of the ovarian cyst on her left. Patient's reported normal menstrual cycles.   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 Pap smear normal 2015  Past medical history,surgical history, family history and social history were all reviewed and documented in the EPIC chart.  Gynecologic History Patient's last menstrual period was 04/13/2015. Contraception: none Last Pap:  2015. Results were: normal Last mamFindings: The breast tissue is heterogeneously dense. Scattered partially obscured partially circumscribed fluctuating masses in the right breast are consistent with cysts. The cluster of calcifications in the right upper outer quadrant posteriorly are amorphous and shape on the CC view. There is a suggestion of some layering on the ML view. No new findings are noted. Mammographic images were processed with CAD.  IMPRESSION: Stable probably benign calcifications in the right breast.  RECOMMENDATION: Bilateral diagnostic mammogram in 6 months.   patient did not return for follow-up mammogram.  Obstetric History OB History  Gravida Para Term Preterm AB SAB TAB Ectopic Multiple Living  1 1 1       1     # Outcome Date GA Lbr Len/2nd Weight Sex Delivery Anes PTL Lv  1 Term     F CS-Unspec  N Y       ROS: A ROS was performed and pertinent positives and negatives are included in the history.  GENERAL: No fevers or chills. HEENT: No change in vision, no earache, sore throat or sinus congestion. NECK: No pain or  stiffness. CARDIOVASCULAR: No chest pain or pressure. No palpitations. PULMONARY: No shortness of breath, cough or wheeze. GASTROINTESTINAL: No abdominal pain, nausea, vomiting or diarrhea, melena or bright red blood per rectum. GENITOURINARY: No urinary frequency, urgency, hesitancy or dysuria. MUSCULOSKELETAL: No joint or muscle pain, no back pain, no recent trauma. DERMATOLOGIC: No rash, no itching, no lesions. ENDOCRINE: No polyuria, polydipsia, no heat or cold intolerance. No recent change in weight. HEMATOLOGICAL: No anemia or easy bruising or bleeding. NEUROLOGIC: No headache, seizures, numbness, tingling or weakness. PSYCHIATRIC: No depression, no loss of interest in normal activity or change in sleep pattern.     Exam: chaperone present  BP 156/90 mmHg  Wt 168 lb (76.204 kg)  LMP 04/13/2015  Body mass index is 29.77 kg/(m^2).  General appearance : Well developed well nourished female. No acute distress HEENT: Eyes: no retinal hemorrhage or exudates,  Neck supple, trachea midline, no carotid bruits, no thyroidmegaly Lungs: Clear to auscultation, no rhonchi or wheezes, or rib retractions  Heart: Regular rate and rhythm, no murmurs or gallops Breast:Examined in sitting and supine position were symmetrical in appearance, no palpable masses or tenderness,  no skin retraction, no nipple inversion, no nipple discharge, no skin discoloration, no axillary or supraclavicular lymphadenopathy Abdomen: no palpable masses or tenderness, no rebound or guarding Extremities: no edema or skin discoloration or tenderness  Pelvic:  Bartholin, Urethra, Skene Glands: Within normal limits             Vagina: No gross lesions or discharge  Cervix:  No gross lesions or discharge  Uterus   anteverted, normal size, shape and consistency, non-tender and mobile  Adnexa  Without masses or tenderness  Anus and perineum  normal   Rectovaginal  normal sphincter tone without palpated masses or tenderness              Hemoccult  Not indicated     Assessment/Plan:  39 y.o. female for annual exam  With signs and symptoms highly suspicious for peptic ulcer disease. Because of her reflux symptoms she will be started on Zantac 300 mg tablet which she is to  Break in half and take half a tablet twice a day. We are going to make an appointment for her to be followed with her gastroenterologist for possible endoscopy. She will return back to the office next week for the following screening blood work: Comprehensive metabolic panel, fasting lipid profile, TSH, CBC, and urinalysis. Pap smear not done today. Patient will be reminded to follow-up with her mammogram the next few weeks. We discussed importance of monthly self breast examination. We discussed the utilization of ovulation predictor kit for timing of her intercourse. Patient declined flu vaccine.   Terrance Mass MD, 3:29 PM 05/02/2015

## 2015-05-03 ENCOUNTER — Telehealth: Payer: Self-pay | Admitting: *Deleted

## 2015-05-03 DIAGNOSIS — K219 Gastro-esophageal reflux disease without esophagitis: Secondary | ICD-10-CM

## 2015-05-03 NOTE — Telephone Encounter (Signed)
Referral placed they will contact pt to schedule. 

## 2015-05-03 NOTE — Telephone Encounter (Signed)
-----   Message from Ok Edwards, MD sent at 05/02/2015  3:29 PM EDT -----  Please make an appointment for this patient with [facility] for this patient to rule out peptic ulcer disease.

## 2015-05-08 ENCOUNTER — Other Ambulatory Visit: Payer: BC Managed Care – PPO

## 2015-05-08 DIAGNOSIS — Z01419 Encounter for gynecological examination (general) (routine) without abnormal findings: Secondary | ICD-10-CM

## 2015-05-08 LAB — CBC WITH DIFFERENTIAL/PLATELET
Basophils Absolute: 0 10*3/uL (ref 0.0–0.1)
Basophils Relative: 1 % (ref 0–1)
Eosinophils Absolute: 0.1 10*3/uL (ref 0.0–0.7)
Eosinophils Relative: 2 % (ref 0–5)
HCT: 34.6 % — ABNORMAL LOW (ref 36.0–46.0)
Hemoglobin: 11.7 g/dL — ABNORMAL LOW (ref 12.0–15.0)
Lymphocytes Relative: 45 % (ref 12–46)
Lymphs Abs: 1.5 10*3/uL (ref 0.7–4.0)
MCH: 32.4 pg (ref 26.0–34.0)
MCHC: 33.8 g/dL (ref 30.0–36.0)
MCV: 95.8 fL (ref 78.0–100.0)
MPV: 10.5 fL (ref 8.6–12.4)
Monocytes Absolute: 0.3 10*3/uL (ref 0.1–1.0)
Monocytes Relative: 9 % (ref 3–12)
Neutro Abs: 1.5 10*3/uL — ABNORMAL LOW (ref 1.7–7.7)
Neutrophils Relative %: 43 % (ref 43–77)
Platelets: 206 10*3/uL (ref 150–400)
RBC: 3.61 MIL/uL — ABNORMAL LOW (ref 3.87–5.11)
RDW: 13.2 % (ref 11.5–15.5)
WBC: 3.4 10*3/uL — ABNORMAL LOW (ref 4.0–10.5)

## 2015-05-08 LAB — LIPID PANEL
Cholesterol: 170 mg/dL (ref 125–200)
HDL: 54 mg/dL (ref >=46)
LDL Cholesterol: 107 mg/dL (ref <130)
Total CHOL/HDL Ratio: 3.1 Ratio (ref <=5.0)
Triglycerides: 47 mg/dL (ref <150)
VLDL: 9 mg/dL (ref <30)

## 2015-05-08 LAB — COMPREHENSIVE METABOLIC PANEL
ALT: 11 U/L (ref 6–29)
AST: 17 U/L (ref 10–30)
Albumin: 4.3 g/dL (ref 3.6–5.1)
Alkaline Phosphatase: 36 U/L (ref 33–115)
BUN: 9 mg/dL (ref 7–25)
CO2: 28 mmol/L (ref 20–31)
Calcium: 8.7 mg/dL (ref 8.6–10.2)
Chloride: 103 mmol/L (ref 98–110)
Creat: 0.76 mg/dL (ref 0.50–1.10)
Glucose, Bld: 82 mg/dL (ref 65–99)
Potassium: 4.1 mmol/L (ref 3.5–5.3)
Sodium: 137 mmol/L (ref 135–146)
Total Bilirubin: 1 mg/dL (ref 0.2–1.2)
Total Protein: 6.4 g/dL (ref 6.1–8.1)

## 2015-05-08 LAB — TSH: TSH: 1.489 u[IU]/mL (ref 0.350–4.500)

## 2015-05-09 LAB — URINALYSIS W MICROSCOPIC + REFLEX CULTURE
Bacteria, UA: NONE SEEN [HPF]
Bilirubin Urine: NEGATIVE
Casts: NONE SEEN [LPF]
Crystals: NONE SEEN [HPF]
Glucose, UA: NEGATIVE
Hgb urine dipstick: NEGATIVE
Ketones, ur: NEGATIVE
Leukocytes, UA: NEGATIVE
Nitrite: NEGATIVE
Protein, ur: NEGATIVE
Specific Gravity, Urine: 1.02 (ref 1.001–1.035)
Squamous Epithelial / LPF: NONE SEEN [HPF] (ref <=5)
WBC, UA: NONE SEEN WBC/HPF (ref <=5)
Yeast: NONE SEEN [HPF]
pH: 8 (ref 5.0–8.0)

## 2015-05-10 ENCOUNTER — Other Ambulatory Visit: Payer: Self-pay | Admitting: Gynecology

## 2015-05-10 DIAGNOSIS — D649 Anemia, unspecified: Secondary | ICD-10-CM

## 2015-05-10 LAB — URINE CULTURE: Colony Count: 90000

## 2015-05-29 NOTE — Telephone Encounter (Signed)
[facility] has tried to call patient to schedule pt has not called back, I called and left message on pt voicemail call [facility].

## 2015-05-30 ENCOUNTER — Encounter: Payer: Self-pay | Admitting: Gastroenterology

## 2015-06-06 NOTE — Telephone Encounter (Signed)
Appointment with Dr.Jacobs on 07/10/15

## 2015-07-10 ENCOUNTER — Encounter: Payer: Self-pay | Admitting: Gastroenterology

## 2015-07-10 ENCOUNTER — Ambulatory Visit (INDEPENDENT_AMBULATORY_CARE_PROVIDER_SITE_OTHER): Payer: BC Managed Care – PPO | Admitting: Gastroenterology

## 2015-07-10 VITALS — BP 130/70 | HR 77 | Ht 63.0 in | Wt 169.2 lb

## 2015-07-10 DIAGNOSIS — R110 Nausea: Secondary | ICD-10-CM | POA: Diagnosis not present

## 2015-07-10 NOTE — Patient Instructions (Signed)
You will be set up for an upper endoscopy for nausea, globus.

## 2015-07-10 NOTE — Progress Notes (Signed)
HPI: This is a   very pleasant 39 year old woman who was referred to me by Dr. Lily Peer to evaluate  intermittent nausea, globus .    Chief complaint is intermittent nausea, globus  Pyrosis, indigestion for 4 months.    A lot of nausea that is getting worse.  For at least a year, certain smells make her nauseas.  Had pyrosis once, acid taste in mouth.  Sore throat.  Took tums, that made it worse.  Lasted about a week.  AFter eating she feels a lump in her throat.  Abd pains a month ago.  Urination around menstral.  Overall stable weight.  Never dysphagia  Pretty rare NSAIDs, at most once per week  Started zantac for 2 months. Usually 3-4 times per week.  She hasn't notice much improvement.  Smells make her nauseas.  Trying to get pregnant.  Review of systems: Pertinent positive and negative review of systems were noted in the above HPI section. Complete review of systems was performed and was otherwise normal.   Past Medical History  Diagnosis Date  . Dysmenorrhea   . VAIN I (vaginal intraepithelial neoplasia grade I)   . High risk HPV infection   . IUD (intrauterine device) in place 11/23/2006    MIRENA IUD INSERTED 11/23/06  . Anemia   . Chronic headaches     Past Surgical History  Procedure Laterality Date  . Cesarean section  1997  . C02 laser of vain i  05/22/2006  . Tonsillectomy  1996  . Left axilla fracture  2009    OPEN REDUCTION W/PLATES AND SCREWS    Current Outpatient Prescriptions  Medication Sig Dispense Refill  . Prenatal Vit-Fe Fumarate-FA (PRENATAL VITAMIN PO) Take by mouth.    . ranitidine (ZANTAC) 300 MG tablet Take 1/2 tablet BID 30 tablet 3   No current facility-administered medications for this visit.    Allergies as of 07/10/2015 - Review Complete 07/10/2015  Allergen Reaction Noted  . Hydrocodone Itching 08/07/2011  . Oxycodone Itching 08/07/2011  . Prednisone Swelling 08/07/2011  . Sulfa antibiotics  04/02/2011  . Tramadol  Swelling 08/07/2011    Family History  Problem Relation Age of Onset  . Diabetes Father     INSULIN DEPENDENT  . Hypertension Father   . Heart disease Father   . Cerebral palsy Brother   . Breast cancer Maternal Grandmother     GRANDMOTHER/NO INDICATED IF PAT. OR MATERNAL  . Asthma Brother   . Colon cancer Neg Hx     Social History   Social History  . Marital Status: Married    Spouse Name: N/A  . Number of Children: 1  . Years of Education: N/A   Occupational History  . teacher    Social History Main Topics  . Smoking status: Never Smoker   . Smokeless tobacco: Never Used  . Alcohol Use: 0.6 oz/week    1 Standard drinks or equivalent per week     Comment: occ...  . Drug Use: No  . Sexual Activity:    Partners: Male    Birth Control/ Protection:    Other Topics Concern  . Not on file   Social History Narrative     Physical Exam: BP 130/70 mmHg  Pulse 77  Ht  (1.6 m)  Wt 169 lb 3.2 oz (76.749 kg)  BMI 29.98 kg/m2  LMP 07/02/2015 Constitutional: generally well-appearing Psychiatric: alert and oriented x3 Eyes: extraocular movements intact Mouth: oral pharynx moist, no lesions Neck: supple no  lymphadenopathy Cardiovascular: heart regular rate and rhythm Lungs: clear to auscultation bilaterally Abdomen: soft, nontender, nondistended, no obvious ascites, no peritoneal signs, normal bowel sounds Extremities: no lower extremity edema bilaterally Skin: no lesions on visible extremities   Assessment and plan: 39 y.o. female with  intermittent nausea, globus sensation  Not clear if this is actually GERD related. She's only had 1 more classic sign of GERD with a pyrosis event several months ago. H2 blockers help but only minimally. Perhaps this is related to hormonal changes since she stopped taking birth control pills. I recommended we proceed with EGD at her soonest convenience to check for H. pylori, peptic ulcer disease. I see no reason for any further  blood tests or imaging studies prior to then. I did not note above that she had CBC last month and it was essentially normal.   Rob Buntinganiel Terina Mcelhinny, MD [name] 07/10/2015, 1:16 PM  Cc: Dr. Lily PeerFernandez

## 2015-07-17 ENCOUNTER — Ambulatory Visit (AMBULATORY_SURGERY_CENTER): Payer: BC Managed Care – PPO | Admitting: Gastroenterology

## 2015-07-17 ENCOUNTER — Encounter: Payer: Self-pay | Admitting: Gastroenterology

## 2015-07-17 VITALS — BP 121/75 | HR 67 | Temp 97.9°F | Resp 19 | Ht 63.0 in | Wt 169.0 lb

## 2015-07-17 DIAGNOSIS — R110 Nausea: Secondary | ICD-10-CM

## 2015-07-17 DIAGNOSIS — K2970 Gastritis, unspecified, without bleeding: Secondary | ICD-10-CM | POA: Diagnosis not present

## 2015-07-17 DIAGNOSIS — R112 Nausea with vomiting, unspecified: Secondary | ICD-10-CM | POA: Diagnosis not present

## 2015-07-17 DIAGNOSIS — K2990 Gastroduodenitis, unspecified, without bleeding: Secondary | ICD-10-CM

## 2015-07-17 MED ORDER — SODIUM CHLORIDE 0.9 % IV SOLN: 500.0000 mL | INTRAVENOUS | Status: DC

## 2015-07-17 NOTE — Progress Notes (Signed)
Report to PACU, RN, vss, BBS= Clear.  

## 2015-07-17 NOTE — Progress Notes (Signed)
Called to room to assist during endoscopic procedure.  Patient ID and intended procedure confirmed with present staff. Received instructions for my participation in the procedure from the performing physician.  

## 2015-07-17 NOTE — Op Note (Signed)
[facility] [address] [place], [id number]   ENDOSCOPY PROCEDURE REPORT  PATIENT: Sabrina [name], Sabrina D  MR#: [id number] BIRTHDATE: [birth year] , 39  yrs. old GENDER: female ENDOSCOPIST: Rachael Feeaniel P Jacobs, MD PROCEDURE DATE:  07/17/2015 PROCEDURE:  EGD w/ biopsy ASA CLASS:     Class I INDICATIONS:  nausea, globus. MEDICATIONS: Monitored anesthesia care and Propofol 200 mg IV TOPICAL ANESTHETIC: none  DESCRIPTION OF PROCEDURE: After the risks benefits and alternatives of the procedure were thoroughly explained, informed consent was obtained.  The LB [id number] endoscope was introduced through the mouth and advanced to the second portion of the duodenum , Without limitations.  The instrument was slowly withdrawn as the mucosa was fully examined.    There was mild, non-specific distal gastritis.  The stomach was biopsied (antrum and body) and sent to pathology.  The examination was otherwise normal.  Retroflexed views revealed no abnormalities. The scope was then withdrawn from the patient and the procedure completed.  COMPLICATIONS: There were no immediate complications.  ENDOSCOPIC IMPRESSION: There was mild, non-specific distal gastritis.  The stomach was biopsied (antrum and body) and sent to pathology.  The examination was otherwise normal  RECOMMENDATIONS: Await biopsy results; if biopsies show H. pylori you will be started on appropriate antibiotics.  If not, then likely will recommend empiric trial of once daily PPI.  eSigned:  Rachael Feeaniel P Jacobs, MD 07/17/2015 2:42 PM

## 2015-07-17 NOTE — Patient Instructions (Signed)
YOU HAD AN ENDOSCOPIC PROCEDURE TODAY AT THE [facility]:   Refer to the procedure report that was given to you for any specific questions about what was found during the examination.  If the procedure report does not answer your questions, please call your gastroenterologist to clarify.  If you requested that your care partner not be given the details of your procedure findings, then the procedure report has been included in a sealed envelope for you to review at your convenience later.  YOU SHOULD EXPECT: Some feelings of bloating in the abdomen. Passage of more gas than usual.  Walking can help get rid of the air that was put into your GI tract during the procedure and reduce the bloating. If you had a lower endoscopy (such as a colonoscopy or flexible sigmoidoscopy) you may notice spotting of blood in your stool or on the toilet paper. If you underwent a bowel prep for your procedure, you may not have a normal bowel movement for a few days.  Please Note:  You might notice some irritation and congestion in your nose or some drainage.  This is from the oxygen used during your procedure.  There is no need for concern and it should clear up in a day or so.  SYMPTOMS TO REPORT IMMEDIATELY:    Following upper endoscopy (EGD)  Vomiting of blood or coffee ground material  New chest pain or pain under the shoulder blades  Painful or persistently difficult swallowing  New shortness of breath  Fever of 100F or higher  Black, tarry-looking stools  For urgent or emergent issues, a gastroenterologist can be reached at any hour by calling [phone].   DIET: Your first meal following the procedure should be a small meal and then it is ok to progress to your normal diet. Heavy or fried foods are harder to digest and may make you feel nauseous or bloated.  Likewise, meals heavy in dairy and vegetables can increase bloating.  Drink plenty of fluids but you should avoid alcoholic beverages  for 24 hours.  ACTIVITY:  You should plan to take it easy for the rest of today and you should NOT DRIVE or use heavy machinery until tomorrow (because of the sedation medicines used during the test).    FOLLOW UP: Our staff will call the number listed on your records the next business day following your procedure to check on you and address any questions or concerns that you may have regarding the information given to you following your procedure. If we do not reach you, we will leave a message.  However, if you are feeling well and you are not experiencing any problems, there is no need to return our call.  We will assume that you have returned to your regular daily activities without incident.  If any biopsies were taken you will be contacted by phone or by letter within the next 1-3 weeks.  Please call us at [phone] if you have not heard about the biopsies in 3 weeks.    SIGNATURES/CONFIDENTIALITY: You and/or your care partner have signed paperwork which will be entered into your electronic medical record.  These signatures attest to the fact that that the information above on your After Visit Summary has been reviewed and is understood.  Full responsibility of the confidentiality of this discharge information lies with you and/or your care-partner.   Resume medications. Information given on gastritis. 

## 2015-07-18 ENCOUNTER — Telehealth: Payer: Self-pay | Admitting: *Deleted

## 2015-07-18 NOTE — Telephone Encounter (Signed)
  Follow up Call-  Call back number 07/17/2015  Post procedure Call Back phone  # [phone]  Permission to leave phone message Yes     Patient questions:  Do you have a fever, pain , or abdominal swelling? Yes.   Pain Score  3 *  Have you tolerated food without any problems? Yes.    Have you been able to return to your normal activities? Yes.    Do you have any questions about your discharge instructions: Diet   No. Medications  No. Follow up visit  No.  Do you have questions or concerns about your Care? No.  Actions: * If pain score is 4 or above: No action needed, pain <4. Patient with dull pain in stomach,"like when you take medication without eating type of feeling." Patient instructed to call if pain worsens 4 or above. Patient in agreement.

## 2015-07-25 ENCOUNTER — Other Ambulatory Visit: Payer: Self-pay

## 2015-07-25 MED ORDER — OMEPRAZOLE 40 MG PO CPDR: 40.0000 mg | DELAYED_RELEASE_CAPSULE | Freq: Every day | ORAL | Status: DC

## 2015-07-25 NOTE — Telephone Encounter (Signed)
Pt has been notified and rx has been sent

## 2015-07-31 ENCOUNTER — Telehealth: Payer: Self-pay | Admitting: *Deleted

## 2015-07-31 MED ORDER — FLUCONAZOLE 150 MG PO TABS: 150.0000 mg | ORAL_TABLET | Freq: Once | ORAL | Status: DC

## 2015-07-31 NOTE — Telephone Encounter (Signed)
Pt aware, Rx sent. 

## 2015-07-31 NOTE — Telephone Encounter (Signed)
(  Dr. Lily PeerFernandez patient) Pt called c/o yeast infection c/o lots of vaginal itching and irritation, on cycle now. Asked if Rx could be sent?

## 2015-07-31 NOTE — Telephone Encounter (Signed)
Okay for Diflucan 150 times one dose, office visit if no relief. 

## 2015-08-06 ENCOUNTER — Ambulatory Visit: Payer: BC Managed Care – PPO | Admitting: Gynecology

## 2015-08-07 ENCOUNTER — Ambulatory Visit (INDEPENDENT_AMBULATORY_CARE_PROVIDER_SITE_OTHER): Payer: BC Managed Care – PPO | Admitting: Gynecology

## 2015-08-07 ENCOUNTER — Encounter: Payer: Self-pay | Admitting: Gynecology

## 2015-08-07 VITALS — BP 126/78

## 2015-08-07 DIAGNOSIS — B9689 Other specified bacterial agents as the cause of diseases classified elsewhere: Secondary | ICD-10-CM

## 2015-08-07 DIAGNOSIS — N760 Acute vaginitis: Secondary | ICD-10-CM

## 2015-08-07 DIAGNOSIS — A499 Bacterial infection, unspecified: Secondary | ICD-10-CM

## 2015-08-07 DIAGNOSIS — N898 Other specified noninflammatory disorders of vagina: Secondary | ICD-10-CM

## 2015-08-07 LAB — WET PREP FOR TRICH, YEAST, CLUE
Trich, Wet Prep: NONE SEEN
Yeast Wet Prep HPF POC: NONE SEEN

## 2015-08-07 MED ORDER — METRONIDAZOLE 500 MG PO TABS
ORAL_TABLET | ORAL | Status: DC
Start: 2015-08-07 — End: 2016-05-05

## 2015-08-07 NOTE — Patient Instructions (Signed)
Metronidazole tablets or capsules What is this medicine? METRONIDAZOLE (me troe NI da zole) is an antiinfective. It is used to treat certain kinds of bacterial and protozoal infections. It will not work for colds, flu, or other viral infections. This medicine may be used for other purposes; ask your health care provider or pharmacist if you have questions. What should I tell my health care provider before I take this medicine? They need to know if you have any of these conditions: -anemia or other blood disorders -disease of the nervous system -fungal or yeast infection -if you drink alcohol containing drinks -liver disease -seizures -an unusual or allergic reaction to metronidazole, or other medicines, foods, dyes, or preservatives -pregnant or trying to get pregnant -breast-feeding How should I use this medicine? Take this medicine by mouth with a full glass of water. Follow the directions on the prescription label. Take your medicine at regular intervals. Do not take your medicine more often than directed. Take all of your medicine as directed even if you think you are better. Do not skip doses or stop your medicine early. Talk to your pediatrician regarding the use of this medicine in children. Special care may be needed. Overdosage: If you think you have taken too much of this medicine contact a poison control center or emergency room at once. NOTE: This medicine is only for you. Do not share this medicine with others. What if I miss a dose? If you miss a dose, take it as soon as you can. If it is almost time for your next dose, take only that dose. Do not take double or extra doses. What may interact with this medicine? Do not take this medicine with any of the following medications: -alcohol or any product that contains alcohol -amprenavir oral solution -cisapride -disulfiram -dofetilide -dronedarone -paclitaxel injection -pimozide -ritonavir oral solution -sertraline oral  solution -sulfamethoxazole-trimethoprim injection -thioridazine -ziprasidone This medicine may also interact with the following medications: -birth control pills -cimetidine -lithium -other medicines that prolong the QT interval (cause an abnormal heart rhythm) -phenobarbital -phenytoin -warfarin This list may not describe all possible interactions. Give your health care provider a list of all the medicines, herbs, non-prescription drugs, or dietary supplements you use. Also tell them if you smoke, drink alcohol, or use illegal drugs. Some items may interact with your medicine. What should I watch for while using this medicine? Tell your doctor or health care professional if your symptoms do not improve or if they get worse. You may get drowsy or dizzy. Do not drive, use machinery, or do anything that needs mental alertness until you know how this medicine affects you. Do not stand or sit up quickly, especially if you are an older patient. This reduces the risk of dizzy or fainting spells. Avoid alcoholic drinks while you are taking this medicine and for three days afterward. Alcohol may make you feel dizzy, sick, or flushed. If you are being treated for a sexually transmitted disease, avoid sexual contact until you have finished your treatment. Your sexual partner may also need treatment. What side effects may I notice from receiving this medicine? Side effects that you should report to your doctor or health care professional as soon as possible: -allergic reactions like skin rash or hives, swelling of the face, lips, or tongue -confusion, clumsiness -difficulty speaking -discolored or sore mouth -dizziness -fever, infection -numbness, tingling, pain or weakness in the hands or feet -trouble passing urine or change in the amount of urine -redness, blistering, peeling   or loosening of the skin, including inside the mouth -seizures -unusually weak or tired -vaginal irritation, dryness,  or discharge Side effects that usually do not require medical attention (report to your doctor or health care professional if they continue or are bothersome): -diarrhea -headache -irritability -metallic taste -nausea -stomach pain or cramps -trouble sleeping This list may not describe all possible side effects. Call your doctor for medical advice about side effects. You may report side effects to FDA at 1-800-FDA-1088. Where should I keep my medicine? Keep out of the reach of children. Store at room temperature below 25 degrees C (77 degrees F). Protect from light. Keep container tightly closed. Throw away any unused medicine after the expiration date. NOTE: This sheet is a summary. It may not cover all possible information. If you have questions about this medicine, talk to your doctor, pharmacist, or health care provider.    2016, Elsevier/Gold Standard. (2013-02-18 14:08:39) Bacterial Vaginosis Bacterial vaginosis is a vaginal infection that occurs when the normal balance of bacteria in the vagina is disrupted. It results from an overgrowth of certain bacteria. This is the most common vaginal infection in women of childbearing age. Treatment is important to prevent complications, especially in pregnant women, as it can cause a premature delivery. CAUSES  Bacterial vaginosis is caused by an increase in harmful bacteria that are normally present in smaller amounts in the vagina. Several different kinds of bacteria can cause bacterial vaginosis. However, the reason that the condition develops is not fully understood. RISK FACTORS Certain activities or behaviors can put you at an increased risk of developing bacterial vaginosis, including:  Having a new sex partner or multiple sex partners.  Douching.  Using an intrauterine device (IUD) for contraception. Women do not get bacterial vaginosis from toilet seats, bedding, swimming pools, or contact with objects around them. SIGNS AND  SYMPTOMS  Some women with bacterial vaginosis have no signs or symptoms. Common symptoms include:  Grey vaginal discharge.  A fishlike odor with discharge, especially after sexual intercourse.  Itching or burning of the vagina and vulva.  Burning or pain with urination. DIAGNOSIS  Your health care provider will take a medical history and examine the vagina for signs of bacterial vaginosis. A sample of vaginal fluid may be taken. Your health care provider will look at this sample under a microscope to check for bacteria and abnormal cells. A vaginal pH test may also be done.  TREATMENT  Bacterial vaginosis may be treated with antibiotic medicines. These may be given in the form of a pill or a vaginal cream. A second round of antibiotics may be prescribed if the condition comes back after treatment. Because bacterial vaginosis increases your risk for sexually transmitted diseases, getting treated can help reduce your risk for chlamydia, gonorrhea, HIV, and herpes. HOME CARE INSTRUCTIONS   Only take over-the-counter or prescription medicines as directed by your health care provider.  If antibiotic medicine was prescribed, take it as directed. Make sure you finish it even if you start to feel better.  Tell all sexual partners that you have a vaginal infection. They should see their health care provider and be treated if they have problems, such as a mild rash or itching.  During treatment, it is important that you follow these instructions:  Avoid sexual activity or use condoms correctly.  Do not douche.  Avoid alcohol as directed by your health care provider.  Avoid breastfeeding as directed by your health care provider. SEEK MEDICAL CARE IF:     Your symptoms are not improving after 3 days of treatment.  You have increased discharge or pain.  You have a fever. MAKE SURE YOU:   Understand these instructions.  Will watch your condition.  Will get help right away if you are not  doing well or get worse. FOR MORE INFORMATION  Centers for Disease Control and Prevention, Division of STD Prevention: www.cdc.gov/std American Sexual Health Association (ASHA): www.ashastd.org    This information is not intended to replace advice given to you by your health care provider. Make sure you discuss any questions you have with your health care provider.   Document Released: 07/14/2005 Document Revised: 08/04/2014 Document Reviewed: 02/23/2013 Elsevier Interactive Patient Education 2016 Elsevier Inc.  

## 2015-08-07 NOTE — Progress Notes (Signed)
   Patient is a 40 year old who presented to the office today complaining of several day history of vaginal discharge. On January 3 she took a Diflucan 150 mg which improved her pruritus persist we'll has a discharge. She is in a monogamous relationship. She is currently taking her prenatal vitamins since she is attempting to get pregnant. Her last menstrual period started on January 3 and was normal. Patient with no other complaints.  Exam: Well-developed well-nourished female with above mentioned complaint Pelvic: Bartholin urethra Skene was within normal limits Vagina clear discharge with slight fishy odor Cervix: No gross lesions Bimanual exam not done Rectal exam: Not done  Wet prep: Many clue cells, few WBC, too numerous to count bacteria  Assessment/plan: Clinical evidence of bacterial vaginosis. Patient on day 8 of her cycle and since she is attempting to get pregnant we will place her on Flagyl 250 mg one by mouth twice a day for 7 days.

## 2015-08-09 ENCOUNTER — Ambulatory Visit: Payer: BC Managed Care – PPO | Admitting: Gynecology

## 2015-10-02 ENCOUNTER — Other Ambulatory Visit: Payer: Self-pay | Admitting: Women's Health

## 2015-10-02 ENCOUNTER — Telehealth: Payer: Self-pay | Admitting: *Deleted

## 2015-10-02 NOTE — Telephone Encounter (Signed)
Phone call, reviewed best to try not to conceive until after vacation due Zeka virus, [place]. Take UPT prior to trip, continue prenatal vitamin daily.

## 2015-10-02 NOTE — Telephone Encounter (Signed)
(  Dr. Lily PeerFernandez patient) pt will be living for cruise on 10/19/15 said that Dr.Fernandez would prescribe at motion sickness patch. Pt asked if you would be willing to prescribe in his absence. Please advise

## 2015-10-02 NOTE — Telephone Encounter (Signed)
Pt informed with the below note, pt said she had husband are trying to conceive, not has not taken at UPT yet, asked if she should take UPT before starting patch?

## 2015-10-02 NOTE — Telephone Encounter (Signed)
Okay, scopolamine patch 1.5 mg apply behind ear prior to leaving on cruise, use as directed on box, dispense 1 box

## 2015-10-03 MED ORDER — SCOPOLAMINE 1 MG/3DAYS TD PT72: 1.0000 | MEDICATED_PATCH | TRANSDERMAL | Status: DC

## 2015-10-03 NOTE — Telephone Encounter (Signed)
Rx sent 

## 2015-10-18 ENCOUNTER — Other Ambulatory Visit: Payer: Self-pay | Admitting: Women's Health

## 2015-10-18 ENCOUNTER — Telehealth: Payer: Self-pay | Admitting: *Deleted

## 2015-10-18 MED ORDER — SCOPOLAMINE 1 MG/3DAYS TD PT72: 1.0000 | MEDICATED_PATCH | TRANSDERMAL | Status: DC

## 2015-10-18 NOTE — Telephone Encounter (Signed)
Telephone call, will try scopolamine patch 1 mg daily 3 days. Generic hopefully will be less expensive. Reviewed importance of wearing sunscreen and insect repellent/Zexa virus.

## 2015-10-18 NOTE — Telephone Encounter (Signed)
(  Dr.Fernandez patient) Pt called to follow up from telephone encounter on 10/02/15 states Rx for scopolamine patch 1.5 mg was $200 which is way too expensive for her. Pt asked if you know anything else she has tried Dramamine but it made her sleepy the whole cruise and she doesn't want that. Pt also said her cycle is due next week,has been having unprotected sex with husband, going to wait to see if skipped cycle before taking UPT.  Any recommendations for motions sickness Rx? Please advise

## 2015-10-18 NOTE — Telephone Encounter (Signed)
Pt called requesting the below Rx sent to a different pharmacy, Rx sent

## 2015-11-22 ENCOUNTER — Ambulatory Visit (INDEPENDENT_AMBULATORY_CARE_PROVIDER_SITE_OTHER): Payer: BC Managed Care – PPO | Admitting: Gynecology

## 2015-11-22 ENCOUNTER — Encounter: Payer: Self-pay | Admitting: Gynecology

## 2015-11-22 VITALS — BP 134/88

## 2015-11-22 DIAGNOSIS — N979 Female infertility, unspecified: Secondary | ICD-10-CM | POA: Insufficient documentation

## 2015-11-22 MED ORDER — DOXYCYCLINE HYCLATE 100 MG PO CAPS: 100.0000 mg | ORAL_CAPSULE | Freq: Two times a day (BID) | ORAL | Status: DC

## 2015-11-22 NOTE — Progress Notes (Signed)
   Patient is a 40 year old gravida 1 para 1 who presented to the office today with her husband for consultation due to the inability to conceive. Patient's first and only pregnancy was with a different partner and she delivered via cesarean section and had conceived spontaneously. She has been trying to conceive over the past year with her new partner. At time she has used the ovulation predictor kit and has timed or intercourse accordingly and hasn't noted a positive change in the urine test but has not conceived. She states that her cycle sometimes fluctuate between 21 and 35 days for the past 2 cycle she was spot for a couple days stop and then spotting again and then eventually her cycle has started. She has denied any nausea, vomiting, breast tenderness or change in bowel movement habits or appetite or weight. Patient denies any past history of any STD. On questioning her husband he has never had any STDs but is suffering from hypertension for which he is on HCTZ and for depression he is on Bupropion and Perphenazine. He reports not having had any children with any other individual in the past.   We went through a lengthy discussion on the evaluation for infertility as well as causes and treatment. 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 For preliminary studies we are going to schedule an HSG next week to confirm tubal patency and to rule out any intracavitary defect such as a polyp or submucous myoma. She will be prescribed Vibramycin 100 mg to take 1 by mouth twice a day for 3 days starting the day before the procedure for prophylaxis.. We are also going to schedule a semen analysis for her husband and he was instructed to abstain from intercourse for 72 hours prior to the study. She was encouraged also to continue to take prenatal vitamins daily. Once both of the above tests are completed she  will return back to the office in consultation for possible ovulation induction.  Literature information on infertility was provided.  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 Time of consultation 15 minutes

## 2015-11-22 NOTE — Patient Instructions (Signed)
Doxycycline tablets or capsules What is this medicine? DOXYCYCLINE (dox i SYE kleen) is a tetracycline antibiotic. It kills certain bacteria or stops their growth. It is used to treat many kinds of infections, like dental, skin, respiratory, and urinary tract infections. It also treats acne, Lyme disease, malaria, and certain sexually transmitted infections. This medicine may be used for other purposes; ask your health care provider or pharmacist if you have questions. What should I tell my health care provider before I take this medicine? They need to know if you have any of these conditions: -liver disease -long exposure to sunlight like working outdoors -stomach problems like colitis -an unusual or allergic reaction to doxycycline, tetracycline antibiotics, other medicines, foods, dyes, or preservatives -pregnant or trying to get pregnant -breast-feeding How should I use this medicine? Take this medicine by mouth with a full glass of water. Follow the directions on the prescription label. It is best to take this medicine without food, but if it upsets your stomach take it with food. Take your medicine at regular intervals. Do not take your medicine more often than directed. Take all of your medicine as directed even if you think you are better. Do not skip doses or stop your medicine early. Talk to your pediatrician regarding the use of this medicine in children. While this drug may be prescribed for selected conditions, precautions do apply. Overdosage: If you think you have taken too much of this medicine contact a poison control center or emergency room at once. NOTE: This medicine is only for you. Do not share this medicine with others. What if I miss a dose? If you miss a dose, take it as soon as you can. If it is almost time for your next dose, take only that dose. Do not take double or extra doses. What may interact with this medicine? -antacids -barbiturates -birth control  pills -bismuth subsalicylate -carbamazepine -methoxyflurane -other antibiotics -phenytoin -vitamins that contain iron -warfarin This list may not describe all possible interactions. Give your health care provider a list of all the medicines, herbs, non-prescription drugs, or dietary supplements you use. Also tell them if you smoke, drink alcohol, or use illegal drugs. Some items may interact with your medicine. What should I watch for while using this medicine? Tell your doctor or health care professional if your symptoms do not improve. Do not treat diarrhea with over the counter products. Contact your doctor if you have diarrhea that lasts more than 2 days or if it is severe and watery. Do not take this medicine just before going to bed. It may not dissolve properly when you lay down and can cause pain in your throat. Drink plenty of fluids while taking this medicine to also help reduce irritation in your throat. This medicine can make you more sensitive to the sun. Keep out of the sun. If you cannot avoid being in the sun, wear protective clothing and use sunscreen. Do not use sun lamps or tanning beds/booths. Birth control pills may not work properly while you are taking this medicine. Talk to your doctor about using an extra method of birth control. If you are being treated for a sexually transmitted infection, avoid sexual contact until you have finished your treatment. Your sexual partner may also need treatment. Avoid antacids, aluminum, calcium, magnesium, and iron products for 4 hours before and 2 hours after taking a dose of this medicine. If you are using this medicine to prevent malaria, you should still protect yourself from contact  with mosquitos. Stay in screened-in areas, use mosquito nets, keep your body covered, and use an insect repellent. What side effects may I notice from receiving this medicine? Side effects that you should report to your doctor or health care professional  as soon as possible: -allergic reactions like skin rash, itching or hives, swelling of the face, lips, or tongue -difficulty breathing -fever -itching in the rectal or genital area -pain on swallowing -redness, blistering, peeling or loosening of the skin, including inside the mouth -severe stomach pain or cramps -unusual bleeding or bruising -unusually weak or tired -yellowing of the eyes or skin Side effects that usually do not require medical attention (report to your doctor or health care professional if they continue or are bothersome): -diarrhea -loss of appetite -nausea, vomiting This list may not describe all possible side effects. Call your doctor for medical advice about side effects. You may report side effects to FDA at 1-800-FDA-1088. Where should I keep my medicine? Keep out of the reach of children. Store at room temperature, below 30 degrees C (86 degrees F). Protect from light. Keep container tightly closed. Throw away any unused medicine after the expiration date. Taking this medicine after the expiration date can make you seriously ill. NOTE: This sheet is a summary. It may not cover all possible information. If you have questions about this medicine, talk to your doctor, pharmacist, or health care provider.    2016, Elsevier/Gold Standard. (2014-11-03 12:10:28) Hysterosalpingography Hysterosalpingography is a procedure to look inside your uterus and fallopian tubes. During this procedure, contrast dye is injected into your uterus through your vagina and cervix to illuminate your uterus while X-ray pictures are taken. This procedure may help your health care provider determine whether you have uterine tumors, adhesions, or structural abnormalities. It is commonly used to help determine why a woman is unable to have children (infertility). The procedure usually lasts about 15-30 minutes. LET [facility] PROVIDER KNOW ABOUT:  Any allergies you have.  All medicines  you are taking, including vitamins, herbs, eye drops, creams, and over-the-counter medicines.  Previous problems you or members of your family have had with the use of anesthetics.  Any blood disorders you have.  Previous surgeries you have had.  Medical conditions you have. RISKS AND COMPLICATIONS  Generally, this is a safe procedure. However, as with any procedure, problems can occur. Possible problems include:  Infection in the lining of the uterus (endometritis) or fallopian tubes (salpingitis).  Damage or perforation of the uterus or fallopian tubes.  An allergic reaction to the contrast dye used to perform the X-ray. BEFORE THE PROCEDURE   Schedule the procedure after your period stops, but before your next ovulation. This is usually between day 5 and day 10 of your last period. Day 1 is the first day of your period.  Ask your health care provider about changing or stopping your regular medicines.  You may eat and drink as normal.  Empty your bladder before the procedure begins. PROCEDURE  You may be given a medicine to relax you (sedative) or an over-the-counter pain medicine to lessen any discomfort during the procedure.  You will lie down on an X-ray table with your feet in stirrups.  A device called a speculum will be placed into your vagina. This allows your health care provider to see inside your vagina to the cervix.  The cervix will be washed with a special soap.  A thin, flexible tube will be passed through the cervix into  the uterus.  Contrast dye will be put into this tube.  Several X-rays will be taken as the contrast dye spreads through the uterus and fallopian tubes.  The tube will be taken out after the procedure. AFTER THE PROCEDURE   Most of the contrast dye will flow out of your vagina naturally. You may want to wear a sanitary pad.  You may feel mild cramping and notice a little bleeding from your vagina. This should go away in 24 hours.  Ask  when your test results will be ready. Make sure you get your test results.   This information is not intended to replace advice given to you by your health care provider. Make sure you discuss any questions you have with your health care provider.   Document Released: 08/16/2004 Document Revised: 07/19/2013 Document Reviewed: 01/14/2013 Elsevier Interactive Patient Education [employer]

## 2015-11-23 ENCOUNTER — Telehealth: Payer: Self-pay | Admitting: *Deleted

## 2015-11-23 DIAGNOSIS — N979 Female infertility, unspecified: Secondary | ICD-10-CM

## 2015-11-23 NOTE — Telephone Encounter (Signed)
-----   Message from Jerilynn Mageslaudia Shaffer sent at 11/22/2015  4:24 PM EDT ----- Regarding: hsg Contact: [phone] Please schedule hsg for JF patient. LMP 11-17-15. She prefers Monday morning or Tuesday pm. JF told her Monday of Tuesday but if another day just pick time. :)

## 2015-11-23 NOTE — Telephone Encounter (Signed)
Appointment on 11[facility]26/15 [facility]  Left message for pt to call.

## 2015-11-23 NOTE — Telephone Encounter (Signed)
Pt informed with the below note. 

## 2015-11-26 ENCOUNTER — Other Ambulatory Visit: Payer: Self-pay | Admitting: Gynecology

## 2015-11-26 ENCOUNTER — Ambulatory Visit (HOSPITAL_COMMUNITY)
Admission: RE | Admit: 2015-11-26 | Discharge: 2015-11-26 | Disposition: A | Discharge status: Home / Self Care | Payer: BC Managed Care – PPO | Source: Ambulatory Visit | Attending: Gynecology | Admitting: Gynecology

## 2015-11-26 DIAGNOSIS — N979 Female infertility, unspecified: Secondary | ICD-10-CM

## 2015-11-26 MED ORDER — IOHEXOL 300 MG/ML  SOLN
50.0000 mL | Freq: Once | INTRAMUSCULAR | Status: AC | PRN
  Administered 2015-11-26: 4 mL

## 2015-11-27 ENCOUNTER — Telehealth: Payer: Self-pay

## 2015-11-27 NOTE — Telephone Encounter (Signed)
Patient said this is the week she will be ovulating/trying to conceive and she wanted to be sure its okay to be on the vibramycin and try to conceive?

## 2015-11-27 NOTE — Telephone Encounter (Signed)
Then I would not take the Vibramycin and take the ibuprofen when necessary

## 2015-11-27 NOTE — Telephone Encounter (Signed)
Patient advised.

## 2015-11-27 NOTE — Telephone Encounter (Signed)
Call in Vibramycin 100 mg to take BID for an additional 4 days. I had prescribed for three days. Call in 8 tablets also Motrin 800 mg tid prn # 30

## 2015-11-27 NOTE — Telephone Encounter (Signed)
-----   Message from Ok EdwardsJuan H Fernandez, MD sent at 11/26/2015 12:12 PM EDT ----- Please inform patient that the HSG demonstrated in one of her fallopian tubes blocked but she can still get pregnant with 1 fallopian tube. Next year her husband and schedule his semen analysis

## 2015-11-27 NOTE — Telephone Encounter (Signed)
Patient said during HSG yesterday she had severe pain with the 3rd time the radiologist tried to get dye through the blocked.  She said it was so severe she started shaking and they had to lay her on her side. Her mom had to come get her and drive her home as she could not drive herself.  She said today pain continues.  No fever and otherwise feels fine. On a 1-10 scale it is a 3 and she feels comfortable managing it with OTC pain relievers. She said she feels the pain at "the base of her cervix" and on the side the tube is blocked on.  She wanted to see what you recommended and if you thought this was okay considering what she went through yesterday.

## 2015-11-30 ENCOUNTER — Telehealth: Payer: Self-pay

## 2015-11-30 ENCOUNTER — Other Ambulatory Visit: Payer: Self-pay | Admitting: Gynecology

## 2015-11-30 MED ORDER — FLUCONAZOLE 100 MG PO TABS: 100.0000 mg | ORAL_TABLET | Freq: Once | ORAL | Status: DC

## 2015-11-30 NOTE — Telephone Encounter (Signed)
Patient informed. Rx sent 

## 2015-11-30 NOTE — Telephone Encounter (Signed)
Patient said she had HSG on Monday and now has symptoms of vaginal yeast infection. She is in her "ovulation period" although not ovulating today. She wanted to see what you recommend as far as trying to treat the yeast infection.

## 2015-11-30 NOTE — Telephone Encounter (Signed)
Diflucan 100 mg PO

## 2015-12-21 ENCOUNTER — Encounter: Payer: Self-pay | Admitting: Gynecology

## 2015-12-21 ENCOUNTER — Ambulatory Visit (INDEPENDENT_AMBULATORY_CARE_PROVIDER_SITE_OTHER): Payer: BC Managed Care – PPO | Admitting: Gynecology

## 2015-12-21 VITALS — BP 124/78

## 2015-12-21 DIAGNOSIS — R102 Pelvic and perineal pain: Secondary | ICD-10-CM | POA: Diagnosis not present

## 2015-12-21 DIAGNOSIS — L292 Pruritus vulvae: Secondary | ICD-10-CM | POA: Diagnosis not present

## 2015-12-21 LAB — WET PREP FOR TRICH, YEAST, CLUE
Clue Cells Wet Prep HPF POC: NONE SEEN
Trich, Wet Prep: NONE SEEN
Yeast Wet Prep HPF POC: NONE SEEN

## 2015-12-21 NOTE — Patient Instructions (Signed)

## 2015-12-21 NOTE — Progress Notes (Signed)
   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 Patient presented to the office today complaining of vulvar pruritus. On May 1 patient had an HSG and she did have prophylactic Vibramycin for 3 days starting the day before the procedure. She will call the office because shortly after that she had some vulvar pruritus and we called her in some Diflucan which she took. Her menstrual cycle started May 18 she bled for 4 days and shortly thereafter she had intercourse and had some burning and irritation the external genitalia. She is in a monogamous relationship. She denies any dysuria, frequency, fever, chills, nausea, or vomiting. No GU complaints.  ROS: A ROS was performed and pertinent positives and negatives are included in the history.  GENERAL: No fevers or chills. HEENT: No change in vision, no earache, sore throat or sinus congestion. NECK: No pain or stiffness. CARDIOVASCULAR: No chest pain or pressure. No palpitations. PULMONARY: No shortness of breath, cough or wheeze. GASTROINTESTINAL: No abdominal pain, nausea, vomiting or diarrhea, melena or bright red blood per rectum. GENITOURINARY: No urinary frequency, urgency, hesitancy or dysuria. MUSCULOSKELETAL: No joint or muscle pain, no back pain, no recent trauma. DERMATOLOGIC: No rash, no itching, no lesions. ENDOCRINE: No polyuria, polydipsia, no heat or cold intolerance. No recent change in weight. HEMATOLOGICAL: No anemia or easy bruising or bleeding. NEUROLOGIC: No headache, seizures, numbness, tingling or weakness. PSYCHIATRIC: No depression, no loss of interest in normal activity or change in sleep pattern.   PE: Blood pressure 124/78 Gen. Appearance well-developed well-nourished female with the above-mentioned complaining Abdomen: Soft nontender no rebound or guarding Pelvic: Bartholin urethra Skene was within normal  limits Vagina: No lesions or discharge Cervix: No lesions or discharge Uterus: Anteverted normal size shape and consistency Adnexa: No palpable masses or tenderness Rectal exam: Not done  Wet prep negative   Assessment Plan: Patient with nonspecific vulvar pruritus she can use Monistat 3 to apply externally. If no improvement her symptoms she will return back to the office. We did discuss today the findings on her HSG which demonstrated that her right fallopian tube was open but her left tube was blocked. Her husband is in the process of getting a semen analysis and then to see me in consultation to plan a course of management.    Greater than 50% of time was spent in counseling and coordinating care of this patient.   Time of consultation: 15   Minutes.

## 2016-03-21 ENCOUNTER — Telehealth: Payer: Self-pay | Admitting: *Deleted

## 2016-03-21 NOTE — Telephone Encounter (Signed)
Pt called to find out semen analysis results, not scanned in yet, I will place paper results in you in basket. Please advise

## 2016-03-21 NOTE — Telephone Encounter (Signed)
Please inform patient that I reviewed her husband semen analysis he had a low volume and low motility and poor for migration but normal sperm count. There appears to be in a low volume collected it may be better to repeat it in 2 weeks after 72 hours of abstinence

## 2016-03-24 NOTE — Telephone Encounter (Signed)
Pt informed with the below note. 

## 2016-03-24 NOTE — Telephone Encounter (Signed)
Left message for pt to call.

## 2016-05-05 ENCOUNTER — Telehealth: Payer: Self-pay | Admitting: *Deleted

## 2016-05-05 ENCOUNTER — Ambulatory Visit (INDEPENDENT_AMBULATORY_CARE_PROVIDER_SITE_OTHER): Payer: BC Managed Care – PPO | Admitting: Gynecology

## 2016-05-05 ENCOUNTER — Encounter: Payer: Self-pay | Admitting: Gynecology

## 2016-05-05 VITALS — BP 124/80

## 2016-05-05 DIAGNOSIS — N898 Other specified noninflammatory disorders of vagina: Secondary | ICD-10-CM | POA: Diagnosis not present

## 2016-05-05 LAB — WET PREP FOR TRICH, YEAST, CLUE
Trich, Wet Prep: NONE SEEN
Yeast Wet Prep HPF POC: NONE SEEN

## 2016-05-05 MED ORDER — METRONIDAZOLE 500 MG PO TABS: 500.0000 mg | ORAL_TABLET | Freq: Two times a day (BID) | ORAL | 0 refills | Status: DC

## 2016-05-05 NOTE — Telephone Encounter (Signed)
-----   Message from Ok EdwardsJuan H Fernandez, MD sent at 05/05/2016 10:12 AM EDT ----- Victorino DikeJennifer, please call the patient. Tell her she was just seen here a few minutes ago. After I put in the prescription for Tindamax for her BV it would be too expensive because her insurance will not cover it. I instead called in a prescription for Flagyl 500 mg twice a day for 7 days.

## 2016-05-05 NOTE — Progress Notes (Signed)
   HPI:  Patient is a 40 year old that presented to the office today stating this and she finished her menses history she begin having a vaginal discharge with some odor no pruritus. She described as light brown. She is married in a monogamous relationship. She is contemplating getting pregnant and is using the ovulation predictor kit the time her intercourse. She is currently taking prenatal vitamins. Patient denies any fever, chills, nausea vomiting or any back pain or any dysuria.   ROS: A ROS was performed and pertinent positives and negatives are included in the history.  GENERAL: No fevers or chills. HEENT: No change in vision, no earache, sore throat or sinus congestion. NECK: No pain or stiffness. CARDIOVASCULAR: No chest pain or pressure. No palpitations. PULMONARY: No shortness of breath, cough or wheeze. GASTROINTESTINAL: No abdominal pain, nausea, vomiting or diarrhea, melena or bright red blood per rectum. GENITOURINARY: No urinary frequency, urgency, hesitancy or dysuria. MUSCULOSKELETAL: No joint or muscle pain, no back pain, no recent trauma. DERMATOLOGIC: No rash, no itching, no lesions. ENDOCRINE: No polyuria, polydipsia, no heat or cold intolerance. No recent change in weight. HEMATOLOGICAL: No anemia or easy bruising or bleeding. NEUROLOGIC: No headache, seizures, numbness, tingling or weakness. PSYCHIATRIC: No depression, no loss of interest in normal activity or change in sleep pattern.   PE: Blood pressure 124/80 Gen. appearance well-developed well-nourished female with the above-mentioned complaining Back: No CVA tenderness Abdomen: Soft nontender no rebound or guarding Pelvic: Bartholin urethra Skene was within normal limits Vagina: Slight brown discharge with odor Cervix: No lesions or discharge Uterus: Anteverted normal size shape and consistency Adnexa: No palpable masses or tenderness Rectal exam not done  Wet prep: Any clue cells, few white blood cells, too numerous  to count bacteria   Assessment Plan: Patient with signs and symptoms and clinical evidence of bacterial vaginosis. She'll be treated with Tindamax 500 mg tablets. She will take 4 tablets today and repeat in 24 hours.  Addendum: After patient left it was noted that her insurance did not cover the Tindamax I will call her and change her medication to Flagyl 500 mg one by mouth twice a day for 7 days.    Greater than 50% of time was spent in counseling and coordinating care of this patient.   Time of consultation: 15   Minutes.

## 2016-05-05 NOTE — Patient Instructions (Signed)
Tinidazole tablets What is this medicine? TINIDAZOLE (tye NI da zole) is an antiinfective. It is used to treat amebiasis, giardiasis, trichomoniasis, and vaginosis. It will not work for colds, flu, or other viral infections. This medicine may be used for other purposes; ask your health care provider or pharmacist if you have questions. What should I tell my health care provider before I take this medicine? They need to know if you have any of these conditions: -anemia or other blood disorders -if you frequently drink alcohol containing drinks -receiving hemodialysis -seizure disorder -an unusual or allergic reaction to tinidazole, other medicines, foods, dyes, or preservatives -pregnant or trying to get pregnant -breast-feeding How should I use this medicine? Take this medicine by mouth with a full glass of water. Follow the directions on the prescription label. Take with food. Take your medicine at regular intervals. Do not take your medicine more often than directed. Take all of your medicine as directed even if you think you are better. Do not skip doses or stop your medicine early. Talk to your pediatrician regarding the use of this medicine in children. While this drug may be prescribed for children as young as 3 years of age for selected conditions, precautions do apply. Overdosage: If you think you have taken too much of this medicine contact a poison control center or emergency room at once. NOTE: This medicine is only for you. Do not share this medicine with others. What if I miss a dose? If you miss a dose, take it as soon as you can. If it is almost time for your next dose, take only that dose. Do not take double or extra doses. What may interact with this medicine? Do not take this medicine with any of the following medications: -alcohol or any product that contains alcohol -amprenavir oral solution -disulfiram -paclitaxel injection -ritonavir oral solution -sertraline oral  solution -sulfamethoxazole-trimethoprim injection This medicine may also interact with the following medications: -cholestyramine -cimetidine -conivaptan -cyclosporin -fluorouracil -fosphenytoin, phenytoin -ketoconazole -lithium -phenobarbital -tacrolimus -warfarin This list may not describe all possible interactions. Give your health care provider a list of all the medicines, herbs, non-prescription drugs, or dietary supplements you use. Also tell them if you smoke, drink alcohol, or use illegal drugs. Some items may interact with your medicine. What should I watch for while using this medicine? Tell your doctor or health care professional if your symptoms do not improve or if they get worse. Avoid alcoholic drinks while you are taking this medicine and for three days afterward. Alcohol may make you feel dizzy, sick, or flushed. If you are being treated for a sexually transmitted disease, avoid sexual contact until you have finished your treatment. Your sexual partner may also need treatment. What side effects may I notice from receiving this medicine? Side effects that you should report to your doctor or health care professional as soon as possible: -allergic reactions like skin rash, itching or hives, swelling of the face, lips, or tongue -breathing problems -confusion, depression -dark or white patches in the mouth -feeling faint or lightheaded, falls -fever, infection -numbness, tingling, pain or weakness in the hands or feet -pain when passing urine -seizures -unusually weak or tired -vaginal irritation or discharge -vomiting Side effects that usually do not require medical attention (report to your doctor or health care professional if they continue or are bothersome): -dark brown or reddish urine -diarrhea -headache -loss of appetite -metallic taste -nausea -stomach upset This list may not describe all possible side effects. Call   your doctor for medical advice about  side effects. You may report side effects to FDA at 1-800-FDA-1088. Where should I keep my medicine? Keep out of the reach of children. Store at room temperature between 15 and 30 degrees C (59 and 86 degrees F). Protect from light and moisture. Keep container tightly closed. Throw away any unused medicine after the expiration date. NOTE: This sheet is a summary. It may not cover all possible information. If you have questions about this medicine, talk to your doctor, pharmacist, or health care provider.    2016, Elsevier/Gold Standard. (2008-04-10 15:22:28) Bacterial Vaginosis Bacterial vaginosis is a vaginal infection that occurs when the normal balance of bacteria in the vagina is disrupted. It results from an overgrowth of certain bacteria. This is the most common vaginal infection in women of childbearing age. Treatment is important to prevent complications, especially in pregnant women, as it can cause a premature delivery. CAUSES  Bacterial vaginosis is caused by an increase in harmful bacteria that are normally present in smaller amounts in the vagina. Several different kinds of bacteria can cause bacterial vaginosis. However, the reason that the condition develops is not fully understood. RISK FACTORS Certain activities or behaviors can put you at an increased risk of developing bacterial vaginosis, including:  Having a new sex partner or multiple sex partners.  Douching.  Using an intrauterine device (IUD) for contraception. Women do not get bacterial vaginosis from toilet seats, bedding, swimming pools, or contact with objects around them. SIGNS AND SYMPTOMS  Some women with bacterial vaginosis have no signs or symptoms. Common symptoms include:  Grey vaginal discharge.  A fishlike odor with discharge, especially after sexual intercourse.  Itching or burning of the vagina and vulva.  Burning or pain with urination. DIAGNOSIS  Your health care provider will take a medical  history and examine the vagina for signs of bacterial vaginosis. A sample of vaginal fluid may be taken. Your health care provider will look at this sample under a microscope to check for bacteria and abnormal cells. A vaginal pH test may also be done.  TREATMENT  Bacterial vaginosis may be treated with antibiotic medicines. These may be given in the form of a pill or a vaginal cream. A second round of antibiotics may be prescribed if the condition comes back after treatment. Because bacterial vaginosis increases your risk for sexually transmitted diseases, getting treated can help reduce your risk for chlamydia, gonorrhea, HIV, and herpes. HOME CARE INSTRUCTIONS   Only take over-the-counter or prescription medicines as directed by your health care provider.  If antibiotic medicine was prescribed, take it as directed. Make sure you finish it even if you start to feel better.  Tell all sexual partners that you have a vaginal infection. They should see their health care provider and be treated if they have problems, such as a mild rash or itching.  During treatment, it is important that you follow these instructions:  Avoid sexual activity or use condoms correctly.  Do not douche.  Avoid alcohol as directed by your health care provider.  Avoid breastfeeding as directed by your health care provider. SEEK MEDICAL CARE IF:   Your symptoms are not improving after 3 days of treatment.  You have increased discharge or pain.  You have a fever. MAKE SURE YOU:   Understand these instructions.  Will watch your condition.  Will get help right away if you are not doing well or get worse. FOR MORE INFORMATION  Centers for Disease   Control and Prevention, Division of STD Prevention: www.cdc.gov/std American Sexual Health Association (ASHA): www.ashastd.org    This information is not intended to replace advice given to you by your health care provider. Make sure you discuss any questions you have  with your health care provider.   Document Released: 07/14/2005 Document Revised: 08/04/2014 Document Reviewed: 02/23/2013 Elsevier Interactive Patient Education 2016 Elsevier Inc.  

## 2016-05-05 NOTE — Telephone Encounter (Signed)
Pt aware, I called pharmacy and canceled tindamax Rx.

## 2016-05-06 ENCOUNTER — Ambulatory Visit (INDEPENDENT_AMBULATORY_CARE_PROVIDER_SITE_OTHER): Payer: BC Managed Care – PPO | Admitting: Physician Assistant

## 2016-05-06 ENCOUNTER — Encounter: Payer: Self-pay | Admitting: Physician Assistant

## 2016-05-06 VITALS — BP 118/78 | HR 68 | Temp 98.8°F | Resp 18 | Ht 63.0 in | Wt 177.8 lb

## 2016-05-06 DIAGNOSIS — R059 Cough, unspecified: Secondary | ICD-10-CM

## 2016-05-06 DIAGNOSIS — R490 Dysphonia: Secondary | ICD-10-CM | POA: Diagnosis not present

## 2016-05-06 DIAGNOSIS — R05 Cough: Secondary | ICD-10-CM

## 2016-05-06 MED ORDER — BENZONATATE 100 MG PO CAPS: 100.0000 mg | ORAL_CAPSULE | Freq: Three times a day (TID) | ORAL | 0 refills | Status: DC | PRN

## 2016-05-06 MED ORDER — GUAIFENESIN ER 1200 MG PO TB12: 1.0000 | ORAL_TABLET | Freq: Two times a day (BID) | ORAL | 1 refills | Status: DC | PRN

## 2016-05-06 NOTE — Progress Notes (Signed)
Patient ID: Sabrina [name], female     DOB: [birth year], 40 y.o.    MRN: [id number]  PCP: Porfirio Oarhelle Lilliona Blakeney, PA-C (new today)  Chief Complaint  Patient presents with  . URI    cough x1 week, woke up this morning and noticed voice was gone. No fever or chills or runny nose    Subjective:    HPI  Presents for evaluation of cough x 1 week and laryngitis that began this morning.  4 weeks ago, she had an upper respiratory illness with productive cough, nasal congestion/drainage. That resolved completely and for 3 weeks she was asymptomatic.  1 week ago she developed a non-productive cough, but without any other URI-type symptoms. This morning she awoke with laryngitis, but still has no sore throat, post-nasal drainage, ear symptoms, fever, chills, GI or GU symptoms. No myalgias or arthralgias. She does report a mild headache.  Her husband was recently diagnosed with acute bronchitis and was treated with a Zpak, but has not experienced resolution.  She is trying to conceive, and prefers to take only what medications are really necessary.  She does not take the ranitidine previously prescribed for GERD. She notes that she never filled the prescription, prescribed after she stopped COC and "a bunch of things happened."   Prior to Admission medications   Medication Sig Start Date End Date Taking? Authorizing Provider  metroNIDAZOLE (FLAGYL) 500 MG tablet Take 1 tablet (500 mg total) by mouth 2 (two) times daily. 05/05/16  Yes Ok EdwardsJuan H Fernandez, MD  Prenatal Vit-Fe Fumarate-FA (PRENATAL VITAMIN PO) Take by mouth.   Yes Historical Provider, MD  ranitidine (ZANTAC) 300 MG tablet Take 1/2 tablet BID Patient not taking: Reported on 05/06/2016 05/02/15   Ok EdwardsJuan H Fernandez, MD     Allergies  Allergen Reactions  . Hydrocodone Itching  . Oxycodone Itching  . Prednisone Swelling    Groggy  . Sulfa Antibiotics   . Tramadol Swelling    Swelling on throat     Patient Active Problem List   Diagnosis Date Noted  . Infertility, female, secondary 11/22/2015  . Esophageal reflux 05/02/2015  . Unspecified constipation 03/28/2014  . Family history of diabetes mellitus 08/11/2011  . VAIN I (vaginal intraepithelial neoplasia grade I)   . High risk HPV infection      Family History  Problem Relation Age of Onset  . Diabetes Father     INSULIN DEPENDENT  . Hypertension Father   . Heart disease Father   . Cerebral palsy Brother   . Breast cancer Maternal Grandmother     GRANDMOTHER/NO INDICATED IF PAT. OR MATERNAL  . Asthma Brother   . Colon cancer Neg Hx      Social History   Social History  . Marital status: Married    Spouse name: N/A  . Number of children: 1  . Years of education: N/A   Occupational History  . teacher    Social History Main Topics  . Smoking status: Never Smoker  . Smokeless tobacco: Never Used  . Alcohol use 0.6 oz/week    1 Standard drinks or equivalent per week     Comment: occ...  . Drug use: No  . Sexual activity: Yes    Partners: Male    Birth control/ protection: , None   Other Topics Concern  . Not on file   Social History Narrative  . No narrative on file        Review of Systems As above.  Objective:  Physical Exam  Constitutional: She is oriented to person, place, and time. She appears well-developed and well-nourished. No distress.  BP 118/78   Pulse 68   Temp 98.8 F (37.1 C) (Oral)   Resp 18   Ht 5\' 3"  (1.6 m)   Wt 177 lb 12.8 oz (80.6 kg)   LMP 04/28/2016   SpO2 100%   BMI 31.50 kg/m    HENT:  Head: Normocephalic and atraumatic.  Right Ear: Hearing and external ear normal.  Left Ear: Hearing, tympanic membrane, external ear and ear canal normal.  Nose: Mucosal edema (mild; turbinates with slightly pale hue) present. No rhinorrhea or sinus tenderness.  No foreign bodies. Right sinus exhibits no maxillary sinus tenderness and no frontal sinus tenderness. Left sinus exhibits no maxillary sinus  tenderness and no frontal sinus tenderness.  Mouth/Throat: Uvula is midline, oropharynx is clear and moist and mucous membranes are normal. No uvula swelling. No oropharyngeal exudate.  Unable to visualize the RIGHT TM due to cerumen in the canal.  Eyes: Conjunctivae and EOM are normal. Pupils are equal, round, and reactive to light. Right eye exhibits no discharge. Left eye exhibits no discharge. No scleral icterus.  Neck: Trachea normal, normal range of motion and full passive range of motion without pain. Neck supple. No thyroid mass and no thyromegaly present.  Voice sounds normal during my interview of the patient, but she notes that it is not normal for her. During the initial part of the visit, staff noted more hoarseness, that improved as she continued to converse with the various team members.  Cardiovascular: Normal rate, regular rhythm and normal heart sounds.   Pulmonary/Chest: Effort normal and breath sounds normal.  Lymphadenopathy:       Head (right side): No submandibular, no tonsillar, no preauricular, no posterior auricular and no occipital adenopathy present.       Head (left side): No submandibular, no tonsillar, no preauricular and no occipital adenopathy present.    She has no cervical adenopathy.       Right: No supraclavicular adenopathy present.       Left: No supraclavicular adenopathy present.  Neurological: She is alert and oriented to person, place, and time. She has normal strength. No cranial nerve deficit or sensory deficit.  Skin: Skin is warm, dry and intact. No rash noted.  Psychiatric: She has a normal mood and affect. Her speech is normal and behavior is normal.             Assessment & Plan:  1. Cough 2. Hoarseness Suspect viral vs allergic etiology, though cannot exclude reflux. Supportive care. Anticipatory guidance provided. If symptoms worsen/persist, plan re-evaluation. - benzonatate (TESSALON) 100 MG capsule; Take 1-2 capsules (100-200 mg  total) by mouth 3 (three) times daily as needed for cough.  Dispense: 40 capsule; Refill: 0 - Guaifenesin (MUCINEX MAXIMUM STRENGTH) 1200 MG TB12; Take 1 tablet (1,200 mg total) by mouth every 12 (twelve) hours as needed.  Dispense: 14 tablet; Refill: 1    Fernande Bras, PA-C Physician Assistant-Certified [facility]

## 2016-05-06 NOTE — Patient Instructions (Addendum)
Get plenty of rest and drink at least 64 ounces of water daily.     IF you received an x-ray today, you will receive an invoice from [facility]. Please contact [facility] at [phone] with questions or concerns regarding your invoice.   IF you received labwork today, you will receive an invoice from [facility]. Please contact [facility] at [phone] with questions or concerns regarding your invoice.   Our billing staff will not be able to assist you with questions regarding bills from these companies.  You will be contacted with the lab results as soon as they are available. The fastest way to get your results is to activate your My Chart account. Instructions are located on the last page of this paperwork. If you have not heard from us regarding the results in 2 weeks, please contact this office.     

## 2016-05-06 NOTE — Progress Notes (Signed)
Subjective:    Patient ID: Sabrina [name], female    DOB: 01-20-1976, 40 y.o.   MRN: [id number] Chief Complaint  Patient presents with  . URI    cough x1 week, woke up this morning and noticed voice was gone. No fever or chills or runny nose    HPI Patient presents today for cough x 7 days. Initial patient reports having a wet cough, rhinorrhea, and sinus pressure 4 weeks ago. These symptoms were consistant with her allergies. After she recovered she had a 3 week period where she had no symptoms. During that time her husband got sick and was diagnosed with acute bronchitis at another clinic. Shortly after she developed a cough (one week ago), and presents today with worsening symptoms. She woke up this morning and reported it was difficult to speak and feels as though her throat is swollen.   Today in the office, patient reports dry, non-productive cough x 7 days. She associates her cough with hoarseness, chest tightness, and loss of appetite. She is able to tolerate liquid and solid foods. Denies fever, chills, diaphoresis,  rinnorhea, ear pain , chest pain, dyspnea, and sinus pressure   Patient Active Problem List   Diagnosis Date Noted  . Infertility, female, secondary 11/22/2015  . Esophageal reflux 05/02/2015  . Unspecified constipation 03/28/2014  . Family history of diabetes mellitus 08/11/2011  . VAIN I (vaginal intraepithelial neoplasia grade I)   . High risk HPV infection    Past Medical History:  Diagnosis Date  . Anemia   . Dysmenorrhea   . High risk HPV infection   . IUD (intrauterine device) in place   . VAIN I (vaginal intraepithelial neoplasia grade I)    Family History  Problem Relation Age of Onset  . Diabetes Father     INSULIN DEPENDENT  . Hypertension Father   . Heart disease Father   . Cerebral palsy Brother   . Breast cancer Maternal Grandmother     GRANDMOTHER/NO INDICATED IF PAT. OR MATERNAL  . Asthma Brother   . Colon cancer Neg Hx    Social  History   Social History  . Marital status: Married    Spouse name: N/A  . Number of children: 1  . Years of education: N/A   Occupational History  . teacher    Social History Main Topics  . Smoking status: Never Smoker  . Smokeless tobacco: Never Used  . Alcohol use 0.6 oz/week    1 Standard drinks or equivalent per week     Comment: occ...  . Drug use: No  . Sexual activity: Yes    Partners: Male    Birth control/ protection:    Other Topics Concern  . Not on file   Social History Narrative  . No narrative on file   Prior to Admission medications   Medication Sig Start Date End Date Taking? Authorizing Provider  metroNIDAZOLE (FLAGYL) 500 MG tablet Take 1 tablet (500 mg total) by mouth 2 (two) times daily. 05/05/16  Yes Ok Edwards, MD  Prenatal Vit-Fe Fumarate-FA (PRENATAL VITAMIN PO) Take by mouth.   Yes Historical Provider, MD  ranitidine (ZANTAC) 300 MG tablet Take 1/2 tablet BID Patient not taking: Reported on 05/06/2016 05/02/15   Ok Edwards, MD     Review of Systems  Constitutional: Positive for appetite change and fatigue. Negative for chills, diaphoresis, fever and unexpected weight change.  HENT: Positive for postnasal drip (This was 3 weeks ago ) and  voice change. Negative for ear discharge, ear pain, rhinorrhea, sore throat and trouble swallowing.   Eyes: Negative for photophobia and visual disturbance.  Respiratory: Positive for cough (dry ) and chest tightness. Negative for choking, shortness of breath and wheezing.   Cardiovascular: Negative for chest pain, palpitations and leg swelling.  Gastrointestinal: Positive for nausea. Negative for diarrhea (Change in bowel movements ) and vomiting.  Genitourinary: Positive for vaginal discharge. Negative for dysuria, frequency and urgency.  Musculoskeletal: Negative for arthralgias and joint swelling.  Skin: Negative for rash.  Neurological: Positive for headaches. Negative for dizziness, weakness and  light-headedness.  Psychiatric/Behavioral: Negative for agitation and behavioral problems.       Objective:   Physical Exam  Constitutional: She appears well-developed and well-nourished. No distress.  HENT:  Head: Normocephalic and atraumatic.  Right Ear: External ear normal.  Left Ear: External ear normal.  Mouth/Throat: Oropharynx is clear and moist. No oropharyngeal exudate.  Rhinorrhea noted in both nostrils  Left TM pink, pearly-grey with no erythema Unable to visualize right TM due to cerumen   Eyes: Conjunctivae are normal. Pupils are equal, round, and reactive to light. Right eye exhibits no discharge. Left eye exhibits no discharge.  Neck: Neck supple. No thyromegaly present.  Cardiovascular: Normal rate, regular rhythm, normal heart sounds and intact distal pulses.  Exam reveals no gallop and no friction rub.   No murmur heard. Pulmonary/Chest: Effort normal and breath sounds normal. No respiratory distress. She has no wheezes. She exhibits no tenderness.  Abdominal: She exhibits no distension.  Musculoskeletal: She exhibits no edema.  Lymphadenopathy:    She has no cervical adenopathy.  Neurological: She is alert. She displays normal reflexes.  Skin: Skin is warm and dry. No rash noted. She is not diaphoretic.      Assessment & Plan:  1. Cough Most likely viral process, with treat symptomatically  - benzonatate (TESSALON) 100 MG capsule; Take 1-2 capsules (100-200 mg total) by mouth 3 (three) times daily as needed for cough.  Dispense: 40 capsule; Refill: 0 - Guaifenesin (MUCINEX MAXIMUM STRENGTH) 1200 MG TB12; Take 1 tablet (1,200 mg total) by mouth every 12 (twelve) hours as needed.  Dispense: 14 tablet; Refill: 1  2. Hoarseness Most likely due to cough, and will resolve with infection, observe for now

## 2016-05-12 ENCOUNTER — Encounter: Payer: BC Managed Care – PPO | Admitting: Gynecology

## 2016-05-12 DIAGNOSIS — Z0289 Encounter for other administrative examinations: Secondary | ICD-10-CM

## 2016-06-09 ENCOUNTER — Ambulatory Visit (INDEPENDENT_AMBULATORY_CARE_PROVIDER_SITE_OTHER): Payer: BC Managed Care – PPO | Admitting: Gynecology

## 2016-06-09 ENCOUNTER — Encounter: Payer: Self-pay | Admitting: Gynecology

## 2016-06-09 VITALS — BP 126/74 | Ht 63.0 in | Wt 183.0 lb

## 2016-06-09 DIAGNOSIS — N979 Female infertility, unspecified: Secondary | ICD-10-CM | POA: Diagnosis not present

## 2016-06-09 DIAGNOSIS — Z01419 Encounter for gynecological examination (general) (routine) without abnormal findings: Secondary | ICD-10-CM | POA: Diagnosis not present

## 2016-06-09 NOTE — Patient Instructions (Signed)
Influenza Virus Vaccine (Flucelvax) What is this medicine? INFLUENZA VIRUS VACCINE (in floo EN zuh VAHY ruhs vak SEEN) helps to reduce the risk of getting influenza also known as the flu. The vaccine only helps protect you against some strains of the flu. This medicine may be used for other purposes; ask your health care provider or pharmacist if you have questions. What should I tell my health care provider before I take this medicine? They need to know if you have any of these conditions: -bleeding disorder like hemophilia -fever or infection -Guillain-Barre syndrome or other neurological problems -immune system problems -infection with the human immunodeficiency virus (HIV) or AIDS -low blood platelet counts -multiple sclerosis -an unusual or allergic reaction to influenza virus vaccine, other medicines, foods, dyes or preservatives -pregnant or trying to get pregnant -breast-feeding How should I use this medicine? This vaccine is for injection into a muscle. It is given by a health care professional. A copy of Vaccine Information Statements will be given before each vaccination. Read this sheet carefully each time. The sheet may change frequently. Talk to your pediatrician regarding the use of this medicine in children. Special care may be needed. Overdosage: If you think you've taken too much of this medicine contact a poison control center or emergency room at once. Overdosage: If you think you have taken too much of this medicine contact a poison control center or emergency room at once. NOTE: This medicine is only for you. Do not share this medicine with others. What if I miss a dose? This does not apply. What may interact with this medicine? -chemotherapy or radiation therapy -medicines that lower your immune system like etanercept, anakinra, infliximab, and adalimumab -medicines that treat or prevent blood clots like warfarin -phenytoin -steroid medicines like prednisone or  cortisone -theophylline -vaccines This list may not describe all possible interactions. Give your health care provider a list of all the medicines, herbs, non-prescription drugs, or dietary supplements you use. Also tell them if you smoke, drink alcohol, or use illegal drugs. Some items may interact with your medicine. What should I watch for while using this medicine? Report any side effects that do not go away within 3 days to your doctor or health care professional. Call your health care provider if any unusual symptoms occur within 6 weeks of receiving this vaccine. You may still catch the flu, but the illness is not usually as bad. You cannot get the flu from the vaccine. The vaccine will not protect against colds or other illnesses that may cause fever. The vaccine is needed every year. What side effects may I notice from receiving this medicine? Side effects that you should report to your doctor or health care professional as soon as possible: -allergic reactions like skin rash, itching or hives, swelling of the face, lips, or tongue Side effects that usually do not require medical attention (Report these to your doctor or health care professional if they continue or are bothersome.): -fever -headache -muscle aches and pains -pain, tenderness, redness, or swelling at the injection site -tiredness This list may not describe all possible side effects. Call your doctor for medical advice about side effects. You may report side effects to FDA at 1-800-FDA-1088. Where should I keep my medicine? The vaccine will be given by a health care professional in a clinic, pharmacy, doctor's office, or other health care setting. You will not be given vaccine doses to store at home. NOTE: This sheet is a summary. It may not cover   all possible information. If you have questions about this medicine, talk to your doctor, pharmacist, or health care provider.    2016, Elsevier/Gold Standard. (2011-06-25  14:06:47) In Vitro Fertilization In vitro fertilization (IVF) is a type of assisted reproductive technology. IVF involves a series of procedures used to treat fertility or genetic problems in order to assist with the conception of a baby. During IVF, eggs are retrieved from the ovaries and combined with sperm in a lab to fertilize the eggs. One or more of the fertilized eggs (embryos) are inserted into the uterus through the cervix. Candidates for IVF include:  People who are infertile.  Women who underwent premature menopause or ovarian failure.  Women who had both ovaries removed. In this case, donor eggs must be used.  Women who have damaged or blocked fallopian tubes. There is no age limit for having IVF, but it is not recommended for postmenopausal women. The ideal age for IVF is 40 years old or younger. Women age 40 and older are often counseled to consider using donor eggs during IVF to increase the chances of success. LET [facility] LLC[facility]OUR HE[facility] CARE PROVIDER KNOW ABOUT:   Any allergies you have.  All medicines you are taking, including vitamins, herbs, eye drops, creams, and over-the-counter medicines.  Previous problems you or members of your family have had with the use of anesthetics.  Any medical or genetic problems that you or members of your family have had.  Any blood disorders you have.  Previous surgeries you have had.  Previous pregnancies you have had.  Medical conditions you have.  Any excessive alcohol drinking or smoking you have done.  Any history of taking illegal drugs. RISKS AND COMPLICATIONS  Generally, IVF procedures are safe. However, as with any procedure, complications can occur. Possible complications include:  Bleeding and infection.  Problems with anesthesia.  Blood clots.  The procedure not working.  Having twins or multiples.  Increased risk of early delivery. BEFORE THE PROCEDURE  Before beginning a cycle of IVF, you and the donor may need  various screenings to make sure IVF is the correct treatment option. Some people may not benefit from this type of assisted reproductive technology.   You and the donor will need to provide a complete medical history and the medical history of your families.  You and the donor will undergo a physical exam.  You and the donor may need blood tests to check for infectious diseases, including HIV.  Other tests that may be done include:  Testing of your ovaries to determine the quality and quantity of your eggs.  Hormone tests and ovulation testing.  An exam of your uterus. This may be done by using ultrasound after liquid is injected into your uterus through your cervix (sonohysterography) or by using a thin, flexible tube with a tiny light and camera on the end of it(hysteroscope).  The donor's sperm will be taken and analyzed to see if they are normal, there are enough present to fertilize the egg, and they act normally after sexual intercourse (postcoital exam). PROCEDURE  IVF involves several steps. These procedures can be done at the health care provider's office or clinic. One cycle of IVF can take about 2 weeks, and more than one cycle may be required. The steps of IVF are:  Ovarian stimulation. If using your own eggs during IVF, at the start of a cycle you will begin treatment with man-made (synthetic) hormones. This stimulates your ovaries to produce multiple eggs, rather than  the single egg that normally develops each month. Multiple eggs are needed because some eggs will not fertilize or develop normally after fertilization.   Egg retrieval. Using ultrasound images as a guide, your health care provider will insert a thin needle through your vagina and into the ovary and sacs (follicles) containing the eggs. The needle is connected to a suction device, which pulls the eggs and fluid out of each follicle, one at a time. The procedure is repeated for the other ovary.  Insemination and  fertilization. The sperm is mixed together with your eggs (insemination) and stored in an environmentally controlled chamber.The sperm usually enters (fertilizes) an egg a few hours after insemination.  Embryo transfer. Your health care provider will place the embryos into your uterus using a thin tube (catheter) containing the embryos. The catheter is inserted into your vagina, through your cervix, and into your uterus. The embryo transfer usually takes place 2-6 days after the egg retrieval. If successful, the embryo will stick to (implant) in the lining of your uterus about 6-10 days after egg retrieval. If an embryo implants in the lining of the uterus and grows, pregnancy results. AFTER THE PROCEDURE   You may have to continue lying down for an hour or more before going home.  You may need to continue to take hormone therapy for about 3 months or as directed by your health care provider.  You may resume your normal diet and activities.   This information is not intended to replace advice given to you by your health care provider. Make sure you discuss any questions you have with your health care provider.   Document Released: 06/26/2008 Document Revised: 03/16/2013 Document Reviewed: 01/20/2013 Elsevier Interactive Patient Education [employer]2016 Elsevier Inc.

## 2016-06-09 NOTE — Progress Notes (Signed)
Sabrina [name] 12-30-1975 [id number]   History:    40 y.o.  for annual gyn exam with no complaints today. Patient with past history of ovarian cysts had a follow-up ultrasound in June 2016 demonstrating completely resolution of the left ovarian cyst. Patient is having normal menstrual cycle. She has had history in the past of VAIN 1 and treated with CO2 laser.. Pap smear normal 2015.Patient's first and only pregnancy was with a different partner and she delivered via cesarean section and had conceived spontaneously. She has been trying to conceive over the past year with her new partner. At time she has used the ovulation predictor kit and has timed or intercourse accordingly and hasn't noted a positive change in the urine test but has not conceived. Early this year she had a hysterosalpingogram which demonstrated right tubal patency but blocked left fallopian tube. Her new husband has had a semen analysis which demonstrated normal count but decreased motility and decreased volume and decreased sperm for migration. Patient currently is trying to time or intercourse but has been unsuccessful and she's currently taking prenatal vitamins and wanted no what her next [place].  Past medical history,surgical history, family history and social history were all reviewed and documented in the EPIC chart.  Gynecologic History Patient's last menstrual period was 05/25/2016. Contraception: none Last Pap: 2013 in 2015. Results were: normal Last mammogram: 2013. Results were: normal  Obstetric History OB History  Gravida Para Term Preterm AB Living  _0 SAB TAB Ectopic Multiple Live Births          1    # Outcome Date GA Lbr Len/2nd Weight Sex Delivery Anes PTL Lv  1 Term     F CS-Unspec  N LIV       ROS: A ROS was performed and pertinent positives and negatives are included in the history.  GENERAL: No fevers or chills. HEENT: No change in vision, no earache, sore throat or sinus  congestion. NECK: No pain or stiffness. CARDIOVASCULAR: No chest pain or pressure. No palpitations. PULMONARY: No shortness of breath, cough or wheeze. GASTROINTESTINAL: No abdominal pain, nausea, vomiting or diarrhea, melena or bright red blood per rectum. GENITOURINARY: No urinary frequency, urgency, hesitancy or dysuria. MUSCULOSKELETAL: No joint or muscle pain, no back pain, no recent trauma. DERMATOLOGIC: No rash, no itching, no lesions. ENDOCRINE: No polyuria, polydipsia, no heat or cold intolerance. No recent change in weight. HEMATOLOGICAL: No anemia or easy bruising or bleeding. NEUROLOGIC: No headache, seizures, numbness, tingling or weakness. PSYCHIATRIC: No depression, no loss of interest in normal activity or change in sleep pattern.     Exam: chaperone present  BP 126/74   Ht _1  (1.6 m)   Wt 183 lb (83 kg)   LMP 05/25/2016   BMI 32.42 kg/m   Body mass index is 32.42 kg/m.  General appearance : Well developed well nourished female. No acute distress HEENT: Eyes: no retinal hemorrhage or exudates,  Neck supple, trachea midline, no carotid bruits, no thyroidmegaly Lungs: Clear to auscultation, no rhonchi or wheezes, or rib retractions  Heart: Regular rate and rhythm, no murmurs or gallops Breast:Examined in sitting and supine position were symmetrical in appearance, no palpable masses or tenderness,  no skin retraction, no nipple inversion, no nipple discharge, no skin discoloration, no axillary or supraclavicular lymphadenopathy Abdomen: no palpable masses or tenderness, no rebound or guarding Extremities: no edema or skin discoloration or tenderness  Pelvic:  Bartholin, Urethra, Skene Glands: Within normal limits             Vagina: No gross lesions or discharge  Cervix: No gross lesions or discharge  Uterus  anteverted, normal size, shape and consistency, non-tender and mobile  Adnexa  Without masses or tenderness  Anus and perineum  normal   Rectovaginal  normal  sphincter tone without palpated masses or tenderness             Hemoccult not indicated     Assessment/Plan:  40 y.o. female for annual exam with secondary infertility, advanced age, one patent fallopian tube and possible female factor infertility as noted above. I recommend because of her age that she be evaluated and treated by reproductive endocrinologist for consideration of in vitro fertilization with or without ICSI. Patient will return back to the office later this week for the following screening fasting blood work: Fasting lipid profile, comprehensive metabolic panel, TSH, CBC, and urinalysis. Patient was given a requisition to schedule her mammogram during the time of her menses and was reminded to continue her prenatal vitamin.   Terrance Mass MD, 4:41 PM 06/09/2016  Patient ID: Sabrina [name], female   DOB: 10/09/1975, 40 y.o.   MRN: [id number]

## 2016-06-10 LAB — URINALYSIS W MICROSCOPIC + REFLEX CULTURE
Bacteria, UA: NONE SEEN [HPF]
Bilirubin Urine: NEGATIVE
Casts: NONE SEEN [LPF]
Crystals: NONE SEEN [HPF]
Glucose, UA: NEGATIVE
Hgb urine dipstick: NEGATIVE
Ketones, ur: NEGATIVE
Leukocytes, UA: NEGATIVE
Nitrite: NEGATIVE
Protein, ur: NEGATIVE
Specific Gravity, Urine: 1.022 (ref 1.001–1.035)
Squamous Epithelial / LPF: NONE SEEN [HPF] (ref <=5)
WBC, UA: NONE SEEN WBC/HPF (ref <=5)
Yeast: NONE SEEN [HPF]
pH: 7.5 (ref 5.0–8.0)

## 2016-06-11 LAB — URINE CULTURE: Organism ID, Bacteria: NO GROWTH

## 2016-06-12 ENCOUNTER — Other Ambulatory Visit: Payer: BC Managed Care – PPO

## 2016-06-12 LAB — CBC WITH DIFFERENTIAL/PLATELET
Basophils Absolute: 0 cells/uL (ref 0–200)
Basophils Relative: 0 %
Eosinophils Absolute: 35 cells/uL (ref 15–500)
Eosinophils Relative: 1 %
HCT: 35.1 % (ref 35.0–45.0)
Hemoglobin: 11.4 g/dL — ABNORMAL LOW (ref 11.7–15.5)
Lymphocytes Relative: 40 %
Lymphs Abs: 1400 cells/uL (ref 850–3900)
MCH: 31.7 pg (ref 27.0–33.0)
MCHC: 32.5 g/dL (ref 32.0–36.0)
MCV: 97.5 fL (ref 80.0–100.0)
MPV: 10 fL (ref 7.5–12.5)
Monocytes Absolute: 315 cells/uL (ref 200–950)
Monocytes Relative: 9 %
Neutro Abs: 1750 cells/uL (ref 1500–7800)
Neutrophils Relative %: 50 %
Platelets: 207 10*3/uL (ref 140–400)
RBC: 3.6 MIL/uL — ABNORMAL LOW (ref 3.80–5.10)
RDW: 13.4 % (ref 11.0–15.0)
WBC: 3.5 10*3/uL — ABNORMAL LOW (ref 3.8–10.8)

## 2016-06-12 LAB — LIPID PANEL
Cholesterol: 187 mg/dL (ref <200)
HDL: 61 mg/dL (ref >50)
LDL Cholesterol: 112 mg/dL — ABNORMAL HIGH (ref <100)
Total CHOL/HDL Ratio: 3.1 Ratio (ref <5.0)
Triglycerides: 72 mg/dL (ref <150)
VLDL: 14 mg/dL (ref <30)

## 2016-06-12 LAB — COMPREHENSIVE METABOLIC PANEL
ALT: 28 U/L (ref 6–29)
AST: 28 U/L (ref 10–30)
Albumin: 4.1 g/dL (ref 3.6–5.1)
Alkaline Phosphatase: 42 U/L (ref 33–115)
BUN: 9 mg/dL (ref 7–25)
CO2: 27 mmol/L (ref 20–31)
Calcium: 9 mg/dL (ref 8.6–10.2)
Chloride: 105 mmol/L (ref 98–110)
Creat: 0.79 mg/dL (ref 0.50–1.10)
Glucose, Bld: 87 mg/dL (ref 65–99)
Potassium: 3.8 mmol/L (ref 3.5–5.3)
Sodium: 138 mmol/L (ref 135–146)
Total Bilirubin: 0.6 mg/dL (ref 0.2–1.2)
Total Protein: 6.4 g/dL (ref 6.1–8.1)

## 2016-06-12 LAB — TSH: TSH: 1.11 mIU/L

## 2016-06-13 ENCOUNTER — Encounter: Payer: Self-pay | Admitting: Gynecology

## 2016-06-13 LAB — VITAMIN D 25 HYDROXY (VIT D DEFICIENCY, FRACTURES): Vit D, 25-Hydroxy: 21 ng/mL — ABNORMAL LOW (ref 30–100)

## 2016-08-20 ENCOUNTER — Encounter: Payer: Self-pay | Admitting: Gynecology

## 2016-08-20 ENCOUNTER — Ambulatory Visit (INDEPENDENT_AMBULATORY_CARE_PROVIDER_SITE_OTHER): Payer: BC Managed Care – PPO | Admitting: Gynecology

## 2016-08-20 VITALS — BP 134/80 | Ht 63.0 in | Wt 177.0 lb

## 2016-08-20 DIAGNOSIS — Z3A01 Less than 8 weeks gestation of pregnancy: Secondary | ICD-10-CM

## 2016-08-20 DIAGNOSIS — N898 Other specified noninflammatory disorders of vagina: Secondary | ICD-10-CM

## 2016-08-20 DIAGNOSIS — N912 Amenorrhea, unspecified: Secondary | ICD-10-CM | POA: Diagnosis not present

## 2016-08-20 LAB — WET PREP FOR TRICH, YEAST, CLUE
Clue Cells Wet Prep HPF POC: NONE SEEN
Trich, Wet Prep: NONE SEEN
WBC, Wet Prep HPF POC: NONE SEEN
Yeast Wet Prep HPF POC: NONE SEEN

## 2016-08-20 LAB — PREGNANCY, URINE: Preg Test, Ur: POSITIVE — AB

## 2016-08-20 MED ORDER — METOCLOPRAMIDE HCL 10 MG PO TABS: 10.0000 mg | ORAL_TABLET | Freq: Three times a day (TID) | ORAL | 1 refills | Status: DC

## 2016-08-20 NOTE — Progress Notes (Signed)
   Patient is a 41 year old now gravida 2 para 1 ((prior C-section) that presented to the office today as a result of a positive home urine pregnancy test. Patient states that her last menstrual cycle started on December 22. She's had some mild nausea but no vomiting some breast tenderness and no vaginal bleeding she was having some slight vaginal pruritus and cut she might have a light discharge. In 2017 patient had a hysterosalpingogram which demonstrated right tubal patency but blocked left fallopian tube. Her new husband has had a semen analysis which demonstrated normal count but decreased motility and decreased volume and decreased sperm for migration.  Exam: Abdomen: Soft nontender no rebound or guarding Pelvic: Bartholin urethra Skene was within normal limits Vagina: No lesions or discharge Cervix: No lesions or discharge Uterus: Upper limits of normal Adnexa: No palpable masses or tenderness Rectal exam not done  Wet prep few bacteria otherwise negative  Urine pregnancy test here in the office confirmed that patient is pregnant  Assessment/plan: First trimester pregnancy based on first day of last menstrual period December 22 patient with currently be approximately 4 to half weeks gestation with a due date 04/26/2017. Patient will have a quantitative beta-hCG today and repeat first of next week and we'll schedule the ultrasound based on results. Patient at time of annual exam last year was found to have vitamin D deficiency she was instructed to discontinue taking vitamin D3 2000 units daily and to continue her prenatal vitamins.

## 2016-08-20 NOTE — Patient Instructions (Addendum)
First Trimester of Pregnancy The first trimester of pregnancy is from week 1 until the end of week 12 (months 1 through 3). A week after a sperm fertilizes an egg, the egg will implant on the wall of the uterus. This embryo will begin to develop into a baby. Genes from you and your partner are forming the baby. The female genes determine whether the baby is a boy or a girl. At 6-8 weeks, the eyes and face are formed, and the heartbeat can be seen on ultrasound. At the end of 12 weeks, all the baby's organs are formed.  Now that you are pregnant, you will want to do everything you can to have a healthy baby. Two of the most important things are to get good prenatal care and to follow your health care provider's instructions. Prenatal care is all the medical care you receive before the baby's birth. This care will help prevent, find, and treat any problems during the pregnancy and childbirth. BODY CHANGES Your body goes through many changes during pregnancy. The changes vary from woman to woman.   You may gain or lose a couple of pounds at first.  You may feel sick to your stomach (nauseous) and throw up (vomit). If the vomiting is uncontrollable, call your health care provider.  You may tire easily.  You may develop headaches that can be relieved by medicines approved by your health care provider.  You may urinate more often. Painful urination may mean you have a bladder infection.  You may develop heartburn as a result of your pregnancy.  You may develop constipation because certain hormones are causing the muscles that push waste through your intestines to slow down.  You may develop hemorrhoids or swollen, bulging veins (varicose veins).  Your breasts may begin to grow larger and become tender. Your nipples may stick out more, and the tissue that surrounds them (areola) may become darker.  Your gums may bleed and may be sensitive to brushing and flossing.  Dark spots or blotches (chloasma,  mask of pregnancy) may develop on your face. This will likely fade after the baby is born.  Your menstrual periods will stop.  You may have a loss of appetite.  You may develop cravings for certain kinds of food.  You may have changes in your emotions from day to day, such as being excited to be pregnant or being concerned that something may go wrong with the pregnancy and baby.  You may have more vivid and strange dreams.  You may have changes in your hair. These can include thickening of your hair, rapid growth, and changes in texture. Some women also have hair loss during or after pregnancy, or hair that feels dry or thin. Your hair will most likely return to normal after your baby is born. WHAT TO EXPECT AT YOUR PRENATAL VISITS During a routine prenatal visit:  You will be weighed to make sure you and the baby are growing normally.  Your blood pressure will be taken.  Your abdomen will be measured to track your baby's growth.  The fetal heartbeat will be listened to starting around week 10 or 12 of your pregnancy.  Test results from any previous visits will be discussed. Your health care provider may ask you:  How you are feeling.  If you are feeling the baby move.  If you have had any abnormal symptoms, such as leaking fluid, bleeding, severe headaches, or abdominal cramping.  If you are using any tobacco products,   including cigarettes, chewing tobacco, and electronic cigarettes.  If you have any questions. Other tests that may be performed during your first trimester include:  Blood tests to find your blood type and to check for the presence of any previous infections. They will also be used to check for low iron levels (anemia) and Rh antibodies. Later in the pregnancy, blood tests for diabetes will be done along with other tests if problems develop.  Urine tests to check for infections, diabetes, or protein in the urine.  An ultrasound to confirm the proper growth  and development of the baby.  An amniocentesis to check for possible genetic problems.  Fetal screens for spina bifida and Down syndrome.  You may need other tests to make sure you and the baby are doing well.  HIV (human immunodeficiency virus) testing. Routine prenatal testing includes screening for HIV, unless you choose not to have this test. HOME CARE INSTRUCTIONS  Medicines   Follow your health care provider's instructions regarding medicine use. Specific medicines may be either safe or unsafe to take during pregnancy.  Take your prenatal vitamins as directed.  If you develop constipation, try taking a stool softener if your health care provider approves. Diet   Eat regular, well-balanced meals. Choose a variety of foods, such as meat or vegetable-based protein, fish, milk and low-fat dairy products, vegetables, fruits, and whole grain breads and cereals. Your health care provider will help you determine the amount of weight gain that is right for you.  Avoid raw meat and uncooked cheese. These carry germs that can cause birth defects in the baby.  Eating four or five small meals rather than three large meals a day may help relieve nausea and vomiting. If you start to feel nauseous, eating a few soda crackers can be helpful. Drinking liquids between meals instead of during meals also seems to help nausea and vomiting.  If you develop constipation, eat more high-fiber foods, such as fresh vegetables or fruit and whole grains. Drink enough fluids to keep your urine clear or pale yellow. Activity and Exercise   Exercise only as directed by your health care provider. Exercising will help you:  Control your weight.  Stay in shape.  Be prepared for labor and delivery.  Experiencing pain or cramping in the lower abdomen or low back is a good sign that you should stop exercising. Check with your health care provider before continuing normal exercises.  Try to avoid standing for  long periods of time. Move your legs often if you must stand in one place for a long time.  Avoid heavy lifting.  Wear low-heeled shoes, and practice good posture.  You may continue to have sex unless your health care provider directs you otherwise. Relief of Pain or Discomfort   Wear a good support bra for breast tenderness.   Take warm sitz baths to soothe any pain or discomfort caused by hemorrhoids. Use hemorrhoid cream if your health care provider approves.   Rest with your legs elevated if you have leg cramps or low back pain.  If you develop varicose veins in your legs, wear support hose. Elevate your feet for 15 minutes, 3-4 times a day. Limit salt in your diet. Prenatal Care   Schedule your prenatal visits by the twelfth week of pregnancy. They are usually scheduled monthly at first, then more often in the last 2 months before delivery.  Write down your questions. Take them to your prenatal visits.  Keep all your prenatal  visits as directed by your health care provider. Safety   Wear your seat belt at all times when driving.  Make a list of emergency phone numbers, including numbers for family, friends, the hospital, and police and fire departments. General Tips   Ask your health care provider for a referral to a local prenatal education class. Begin classes no later than at the beginning of month 6 of your pregnancy.  Ask for help if you have counseling or nutritional needs during pregnancy. Your health care provider can offer advice or refer you to specialists for help with various needs.  Do not use hot tubs, steam rooms, or saunas.  Do not douche or use tampons or scented sanitary pads.  Do not cross your legs for long periods of time.  Avoid cat litter boxes and soil used by cats. These carry germs that can cause birth defects in the baby and possibly loss of the fetus by miscarriage or stillbirth.  Avoid all smoking, herbs, alcohol, and medicines not  prescribed by your health care provider. Chemicals in these affect the formation and growth of the baby.  Do not use any tobacco products, including cigarettes, chewing tobacco, and electronic cigarettes. If you need help quitting, ask your health care provider. You may receive counseling support and other resources to help you quit.  Schedule a dentist appointment. At home, brush your teeth with a soft toothbrush and be gentle when you floss. SEEK MEDICAL CARE IF:   You have dizziness.  You have mild pelvic cramps, pelvic pressure, or nagging pain in the abdominal area.  You have persistent nausea, vomiting, or diarrhea.  You have a bad smelling vaginal discharge.  You have pain with urination.  You notice increased swelling in your face, hands, legs, or ankles. SEEK IMMEDIATE MEDICAL CARE IF:   You have a fever.  You are leaking fluid from your vagina.  You have spotting or bleeding from your vagina.  You have severe abdominal cramping or pain.  You have rapid weight gain or loss.  You vomit blood or material that looks like coffee grounds.  You are exposed to [place] and have never had them.  You are exposed to fifth disease or chickenpox.  You develop a severe headache.  You have shortness of breath.  You have any kind of trauma, such as from a fall or a car accident. This information is not intended to replace advice given to you by your health care provider. Make sure you discuss any questions you have with your health care provider. Document Released: 07/08/2001 Document Revised: 08/04/2014 Document Reviewed: 05/24/2013 Elsevier Interactive Patient Education  2017 [employer]    Vaginal Yeast infection, Adult Vaginal yeast infection is a condition that causes soreness, swelling, and redness (inflammation) of the vagina. It also causes vaginal discharge. This is a common condition. Some women get this infection frequently. What are the causes? This  condition is caused by a change in the normal balance of the yeast (candida) and bacteria that live in the vagina. This change causes an overgrowth of yeast, which causes the inflammation. What increases the risk? This condition is more likely to develop in:  Women who take antibiotic medicines.  Women who have diabetes.  Women who take birth control pills.  Women who are pregnant.  Women who douche often.  Women who have a weak defense (immune) system.  Women who have been taking steroid medicines for a long time.  Women who frequently wear tight  clothing. What are the signs or symptoms? Symptoms of this condition include:  White, thick vaginal discharge.  Swelling, itching, redness, and irritation of the vagina. The lips of the vagina (vulva) may be affected as well.  Pain or a burning feeling while urinating.  Pain during sex. How is this diagnosed? This condition is diagnosed with a medical history and physical exam. This will include a pelvic exam. Your health care provider will examine a sample of your vaginal discharge under a microscope. Your health care provider may send this sample for testing to confirm the diagnosis. How is this treated? This condition is treated with medicine. Medicines may be over-the-counter or prescription. You may be told to use one or more of the following:  Medicine that is taken orally.  Medicine that is applied as a cream.  Medicine that is inserted directly into the vagina (suppository). Follow these instructions at home:  Take or apply over-the-counter and prescription medicines only as told by your health care provider.  Do not have sex until your health care provider has approved. Tell your sex partner that you have a yeast infection. That person should go to his or her health care provider if he or she develops symptoms.  Do not wear tight clothes, such as pantyhose or tight pants.  Avoid using tampons until your health care  provider approves.  Eat more yogurt. This may help to keep your yeast infection from returning.  Try taking a sitz bath to help with discomfort. This is a warm water bath that is taken while you are sitting down. The water should only come up to your hips and should cover your buttocks. Do this 3-4 times per day or as told by your health care provider.  Do not douche.  Wear breathable, cotton underwear.  If you have diabetes, keep your blood sugar levels under control. Contact a health care provider if:  You have a fever.  Your symptoms go away and then return.  Your symptoms do not get better with treatment.  Your symptoms get worse.  You have new symptoms.  You develop blisters in or around your vagina.  You have blood coming from your vagina and it is not your menstrual period.  You develop pain in your abdomen. This information is not intended to replace advice given to you by your health care provider. Make sure you discuss any questions you have with your health care provider. Document Released: 04/23/2005 Document Revised: 12/26/2015 Document Reviewed: 01/15/2015 Elsevier Interactive Patient Education  2017 [employer].

## 2016-08-21 LAB — HCG, SERUM, QUALITATIVE: Preg, Serum: POSITIVE — ABNORMAL HIGH

## 2016-08-25 ENCOUNTER — Other Ambulatory Visit: Payer: BC Managed Care – PPO

## 2016-08-25 DIAGNOSIS — N912 Amenorrhea, unspecified: Secondary | ICD-10-CM

## 2016-08-25 DIAGNOSIS — Z3A01 Less than 8 weeks gestation of pregnancy: Secondary | ICD-10-CM

## 2016-08-26 ENCOUNTER — Other Ambulatory Visit: Payer: Self-pay | Admitting: Gynecology

## 2016-08-26 DIAGNOSIS — Z369 Encounter for antenatal screening, unspecified: Secondary | ICD-10-CM

## 2016-08-26 LAB — HCG, QUANTITATIVE, PREGNANCY: hCG, Beta Chain, Quant, S: 3158.3 m[IU]/mL — ABNORMAL HIGH

## 2016-09-02 ENCOUNTER — Other Ambulatory Visit: Payer: BC Managed Care – PPO

## 2016-09-02 ENCOUNTER — Other Ambulatory Visit: Payer: Self-pay | Admitting: Gynecology

## 2016-09-02 DIAGNOSIS — Z369 Encounter for antenatal screening, unspecified: Secondary | ICD-10-CM

## 2016-09-03 LAB — HCG, TOTAL, QUANTITATIVE: hCG, Beta Chain, Quant, S: 8680.7 m[IU]/mL — ABNORMAL HIGH

## 2016-09-05 ENCOUNTER — Ambulatory Visit (INDEPENDENT_AMBULATORY_CARE_PROVIDER_SITE_OTHER): Payer: BC Managed Care – PPO

## 2016-09-05 ENCOUNTER — Encounter: Payer: Self-pay | Admitting: Gynecology

## 2016-09-05 ENCOUNTER — Other Ambulatory Visit: Payer: Self-pay | Admitting: Gynecology

## 2016-09-05 ENCOUNTER — Ambulatory Visit (INDEPENDENT_AMBULATORY_CARE_PROVIDER_SITE_OTHER): Payer: BC Managed Care – PPO | Admitting: Gynecology

## 2016-09-05 VITALS — BP 120/82 | Ht 63.0 in | Wt 177.0 lb

## 2016-09-05 DIAGNOSIS — O09521 Supervision of elderly multigravida, first trimester: Secondary | ICD-10-CM

## 2016-09-05 DIAGNOSIS — O200 Threatened abortion: Secondary | ICD-10-CM

## 2016-09-05 DIAGNOSIS — Z3201 Encounter for pregnancy test, result positive: Secondary | ICD-10-CM

## 2016-09-05 DIAGNOSIS — Z369 Encounter for antenatal screening, unspecified: Secondary | ICD-10-CM

## 2016-09-05 DIAGNOSIS — Z3A01 Less than 8 weeks gestation of pregnancy: Secondary | ICD-10-CM

## 2016-09-05 NOTE — Patient Instructions (Signed)
Subchorionic Hematoma A subchorionic hematoma is a gathering of blood between the outer wall of the placenta and the inner wall of the womb (uterus). The placenta is the organ that connects the fetus to the wall of the uterus. The placenta performs the feeding, breathing (oxygen to the fetus), and waste removal (excretory work) of the fetus.  Subchorionic hematoma is the most common abnormality found on a result from ultrasonography done during the first trimester or early second trimester of pregnancy. If there has been little or no vaginal bleeding, early small hematomas usually shrink on their own and do not affect your baby or pregnancy. The blood is gradually absorbed over 1-2 weeks. When bleeding starts later in pregnancy or the hematoma is larger or occurs in an older pregnant woman, the outcome may not be as good. Larger hematomas may get bigger, which increases the chances for miscarriage. Subchorionic hematoma also increases the risk of premature detachment of the placenta from the uterus, preterm (premature) labor, and stillbirth. HOME CARE INSTRUCTIONS  Stay on bed rest if your health care provider recommends this. Although bed rest will not prevent more bleeding or prevent a miscarriage, your health care provider may recommend bed rest until you are advised otherwise.  Avoid heavy lifting (more than 10 lb [4.5 kg]), exercise, sexual intercourse, or douching as directed by your health care provider.  Keep track of the number of pads you use each day and how soaked (saturated) they are. Write down this information.  Do not use tampons.  Keep all follow-up appointments as directed by your health care provider. Your health care provider may ask you to have follow-up blood tests or ultrasound tests or both. SEEK IMMEDIATE MEDICAL CARE IF:  You have severe cramps in your stomach, back, abdomen, or pelvis.  You have a fever.  You pass large clots or tissue. Save any tissue for your health  care provider to look at.  Your bleeding increases or you become lightheaded, feel weak, or have fainting episodes. This information is not intended to replace advice given to you by your health care provider. Make sure you discuss any questions you have with your health care provider. Document Released: 10/29/2006 Document Revised: 08/04/2014 Document Reviewed: 02/10/2013 Elsevier Interactive Patient Education  2017 [employer]. First Trimester of Pregnancy The first trimester of pregnancy is from week 1 until the end of week 12 (months 1 through 3). A week after a sperm fertilizes an egg, the egg will implant on the wall of the uterus. This embryo will begin to develop into a baby. Genes from you and your partner are forming the baby. The female genes determine whether the baby is a boy or a girl. At 6-8 weeks, the eyes and face are formed, and the heartbeat can be seen on ultrasound. At the end of 12 weeks, all the baby's organs are formed.  Now that you are pregnant, you will want to do everything you can to have a healthy baby. Two of the most important things are to get good prenatal care and to follow your health care provider's instructions. Prenatal care is all the medical care you receive before the baby's birth. This care will help prevent, find, and treat any problems during the pregnancy and childbirth. BODY CHANGES Your body goes through many changes during pregnancy. The changes vary from woman to woman.   You may gain or lose a couple of pounds at first.  You may feel sick to your stomach (nauseous) and  throw up (vomit). If the vomiting is uncontrollable, call your health care provider.  You may tire easily.  You may develop headaches that can be relieved by medicines approved by your health care provider.  You may urinate more often. Painful urination may mean you have a bladder infection.  You may develop heartburn as a result of your pregnancy.  You may develop constipation  because certain hormones are causing the muscles that push waste through your intestines to slow down.  You may develop hemorrhoids or swollen, bulging veins (varicose veins).  Your breasts may begin to grow larger and become tender. Your nipples may stick out more, and the tissue that surrounds them (areola) may become darker.  Your gums may bleed and may be sensitive to brushing and flossing.  Dark spots or blotches (chloasma, mask of pregnancy) may develop on your face. This will likely fade after the baby is born.  Your menstrual periods will stop.  You may have a loss of appetite.  You may develop cravings for certain kinds of food.  You may have changes in your emotions from day to day, such as being excited to be pregnant or being concerned that something may go wrong with the pregnancy and baby.  You may have more vivid and strange dreams.  You may have changes in your hair. These can include thickening of your hair, rapid growth, and changes in texture. Some women also have hair loss during or after pregnancy, or hair that feels dry or thin. Your hair will most likely return to normal after your baby is born. WHAT TO EXPECT AT YOUR PRENATAL VISITS During a routine prenatal visit:  You will be weighed to make sure you and the baby are growing normally.  Your blood pressure will be taken.  Your abdomen will be measured to track your baby's growth.  The fetal heartbeat will be listened to starting around week 10 or 12 of your pregnancy.  Test results from any previous visits will be discussed. Your health care provider may ask you:  How you are feeling.  If you are feeling the baby move.  If you have had any abnormal symptoms, such as leaking fluid, bleeding, severe headaches, or abdominal cramping.  If you are using any tobacco products, including cigarettes, chewing tobacco, and electronic cigarettes.  If you have any questions. Other tests that may be performed  during your first trimester include:  Blood tests to find your blood type and to check for the presence of any previous infections. They will also be used to check for low iron levels (anemia) and Rh antibodies. Later in the pregnancy, blood tests for diabetes will be done along with other tests if problems develop.  Urine tests to check for infections, diabetes, or protein in the urine.  An ultrasound to confirm the proper growth and development of the baby.  An amniocentesis to check for possible genetic problems.  Fetal screens for spina bifida and Down syndrome.  You may need other tests to make sure you and the baby are doing well.  HIV (human immunodeficiency virus) testing. Routine prenatal testing includes screening for HIV, unless you choose not to have this test. HOME CARE INSTRUCTIONS  Medicines   Follow your health care provider's instructions regarding medicine use. Specific medicines may be either safe or unsafe to take during pregnancy.  Take your prenatal vitamins as directed.  If you develop constipation, try taking a stool softener if your health care provider approves. Diet  Eat regular, well-balanced meals. Choose a variety of foods, such as meat or vegetable-based protein, fish, milk and low-fat dairy products, vegetables, fruits, and whole grain breads and cereals. Your health care provider will help you determine the amount of weight gain that is right for you.  Avoid raw meat and uncooked cheese. These carry germs that can cause birth defects in the baby.  Eating four or five small meals rather than three large meals a day may help relieve nausea and vomiting. If you start to feel nauseous, eating a few soda crackers can be helpful. Drinking liquids between meals instead of during meals also seems to help nausea and vomiting.  If you develop constipation, eat more high-fiber foods, such as fresh vegetables or fruit and whole grains. Drink enough fluids to keep  your urine clear or pale yellow. Activity and Exercise   Exercise only as directed by your health care provider. Exercising will help you:  Control your weight.  Stay in shape.  Be prepared for labor and delivery.  Experiencing pain or cramping in the lower abdomen or low back is a good sign that you should stop exercising. Check with your health care provider before continuing normal exercises.  Try to avoid standing for long periods of time. Move your legs often if you must stand in one place for a long time.  Avoid heavy lifting.  Wear low-heeled shoes, and practice good posture.  You may continue to have sex unless your health care provider directs you otherwise. Relief of Pain or Discomfort   Wear a good support bra for breast tenderness.   Take warm sitz baths to soothe any pain or discomfort caused by hemorrhoids. Use hemorrhoid cream if your health care provider approves.   Rest with your legs elevated if you have leg cramps or low back pain.  If you develop varicose veins in your legs, wear support hose. Elevate your feet for 15 minutes, 3-4 times a day. Limit salt in your diet. Prenatal Care   Schedule your prenatal visits by the twelfth week of pregnancy. They are usually scheduled monthly at first, then more often in the last 2 months before delivery.  Write down your questions. Take them to your prenatal visits.  Keep all your prenatal visits as directed by your health care provider. Safety   Wear your seat belt at all times when driving.  Make a list of emergency phone numbers, including numbers for family, friends, the hospital, and police and fire departments. General Tips   Ask your health care provider for a referral to a local prenatal education class. Begin classes no later than at the beginning of month 6 of your pregnancy.  Ask for help if you have counseling or nutritional needs during pregnancy. Your health care provider can offer advice or refer  you to specialists for help with various needs.  Do not use hot tubs, steam rooms, or saunas.  Do not douche or use tampons or scented sanitary pads.  Do not cross your legs for long periods of time.  Avoid cat litter boxes and soil used by cats. These carry germs that can cause birth defects in the baby and possibly loss of the fetus by miscarriage or stillbirth.  Avoid all smoking, herbs, alcohol, and medicines not prescribed by your health care provider. Chemicals in these affect the formation and growth of the baby.  Do not use any tobacco products, including cigarettes, chewing tobacco, and electronic cigarettes. If you need help quitting,  ask your health care provider. You may receive counseling support and other resources to help you quit.  Schedule a dentist appointment. At home, brush your teeth with a soft toothbrush and be gentle when you floss. SEEK MEDICAL CARE IF:   You have dizziness.  You have mild pelvic cramps, pelvic pressure, or nagging pain in the abdominal area.  You have persistent nausea, vomiting, or diarrhea.  You have a bad smelling vaginal discharge.  You have pain with urination.  You notice increased swelling in your face, hands, legs, or ankles. SEEK IMMEDIATE MEDICAL CARE IF:   You have a fever.  You are leaking fluid from your vagina.  You have spotting or bleeding from your vagina.  You have severe abdominal cramping or pain.  You have rapid weight gain or loss.  You vomit blood or material that looks like coffee grounds.  You are exposed to [place] measles and have never had them.  You are exposed to fifth disease or chickenpox.  You develop a severe headache.  You have shortness of breath.  You have any kind of trauma, such as from a fall or a car accident. This information is not intended to replace advice given to you by your health care provider. Make sure you discuss any questions you have with your health care  provider. Document Released: 07/08/2001 Document Revised: 08/04/2014 Document Reviewed: 05/24/2013 Elsevier Interactive Patient Education  2017 [employer].

## 2016-09-05 NOTE — Progress Notes (Signed)
   Patient is a 41 year old now gravida 2 para 1 (1 prior cesarean section) that was seen in the office on January 24 as a result of a positive home pregnancy test. Patient reported that the first day of her last menstrual period was December 22. She denied any vaginal bleeding and is here for ultrasound to confirm viability and dates. She did have quantitative beta-hCG with results as follows:  Results for Vincenza HewsSCOTT, Audrea D. (MRN [id number]) as of 09/05/2016 11:25  Ref. Range 08/25/2016 15:10 09/02/2016 15:40  HCG, Beta Chain, Quant, S Latest Units: mIU/mL 3,158.3 (H) 8,680.7 (H)    Ultrasound today: 5. uterine pregnancy size slightly less than date estimated gestational age [redacted] weeks by last menstrual period by crown-rump length 6 weeks and 1 day. Fetal pole and cardiac activity recorded 107 bpm was noted. A small right corpus luteum cyst measuring 24 x 19 mm was noted left ovary was normal. Cervix is long closed. There was noted a small subchorionic hematoma left lateral gestational sac wall measuring 16 x 7 x 10 mm.  Assessment/plan: First trimester pregnancy less than one week difference between last menstrual period and ultrasound. Patient was small subchorionic hematoma threatened AB precautions were provided. She was instructed to refrain from intercourse range strenuous activity until she finishes the first trimester. I've given her copy of the ultrasound report for her to take for her first prenatal visit. I've given her the name of community obstetrician for her pregnancy care.

## 2016-10-01 ENCOUNTER — Other Ambulatory Visit: Payer: Self-pay | Admitting: Obstetrics and Gynecology

## 2016-10-01 ENCOUNTER — Encounter (HOSPITAL_COMMUNITY): Payer: Self-pay | Admitting: *Deleted

## 2016-10-02 ENCOUNTER — Ambulatory Visit (HOSPITAL_COMMUNITY): Payer: BC Managed Care – PPO | Admitting: Anesthesiology

## 2016-10-02 ENCOUNTER — Ambulatory Visit (HOSPITAL_COMMUNITY)
Admission: RE | Admit: 2016-10-02 | Discharge: 2016-10-02 | Disposition: A | Discharge status: Home / Self Care | Payer: BC Managed Care – PPO | Source: Ambulatory Visit | Attending: Obstetrics and Gynecology | Admitting: Obstetrics and Gynecology

## 2016-10-02 ENCOUNTER — Encounter (HOSPITAL_COMMUNITY)
Admission: RE | Disposition: A | Discharge status: Home / Self Care | Payer: Self-pay | Source: Ambulatory Visit | Attending: Obstetrics and Gynecology

## 2016-10-02 ENCOUNTER — Encounter (HOSPITAL_COMMUNITY): Payer: Self-pay

## 2016-10-02 DIAGNOSIS — Z79899 Other long term (current) drug therapy: Secondary | ICD-10-CM | POA: Insufficient documentation

## 2016-10-02 DIAGNOSIS — O020 Blighted ovum and nonhydatidiform mole: Secondary | ICD-10-CM | POA: Insufficient documentation

## 2016-10-02 HISTORY — DX: Other complications of anesthesia, initial encounter: T88.59XA

## 2016-10-02 HISTORY — DX: Unspecified asthma, uncomplicated: J45.909

## 2016-10-02 HISTORY — DX: Disease of blood and blood-forming organs, unspecified: D75.9

## 2016-10-02 HISTORY — PX: DILATION AND EVACUATION: SHX1459

## 2016-10-02 HISTORY — DX: Adverse effect of unspecified anesthetic, initial encounter: T41.45XA

## 2016-10-02 SURGERY — DILATION AND EVACUATION, UTERUS
Anesthesia: Monitor Anesthesia Care | Site: Vagina

## 2016-10-02 MED ORDER — KETOROLAC TROMETHAMINE 30 MG/ML IJ SOLN
INTRAMUSCULAR | Status: AC
  Filled 2016-10-02: qty 1

## 2016-10-02 MED ORDER — PROPOFOL 500 MG/50ML IV EMUL
INTRAVENOUS | Status: DC | PRN
  Administered 2016-10-02: 50 mg via INTRAVENOUS
  Administered 2016-10-02: 100 mg via INTRAVENOUS
  Administered 2016-10-02: 50 mg via INTRAVENOUS

## 2016-10-02 MED ORDER — PROPOFOL 500 MG/50ML IV EMUL
INTRAVENOUS | Status: DC | PRN
  Administered 2016-10-02: 75 ug/kg/min via INTRAVENOUS

## 2016-10-02 MED ORDER — CHLOROPROCAINE HCL 1 % IJ SOLN
INTRAMUSCULAR | Status: AC
  Filled 2016-10-02: qty 30

## 2016-10-02 MED ORDER — FENTANYL CITRATE (PF) 100 MCG/2ML IJ SOLN
INTRAMUSCULAR | Status: AC
  Filled 2016-10-02: qty 2

## 2016-10-02 MED ORDER — FENTANYL CITRATE (PF) 100 MCG/2ML IJ SOLN
INTRAMUSCULAR | Status: DC | PRN
  Administered 2016-10-02: 100 ug via INTRAVENOUS

## 2016-10-02 MED ORDER — CHLOROPROCAINE HCL 1 % IJ SOLN
INTRAMUSCULAR | Status: DC | PRN
  Administered 2016-10-02: 20 mL

## 2016-10-02 MED ORDER — SCOPOLAMINE 1 MG/3DAYS TD PT72
1.0000 | MEDICATED_PATCH | Freq: Once | TRANSDERMAL | Status: DC
  Administered 2016-10-02: 1.5 mg via TRANSDERMAL

## 2016-10-02 MED ORDER — DOXYCYCLINE HYCLATE 100 MG IV SOLR
100.0000 mg | Freq: Two times a day (BID) | INTRAVENOUS | Status: DC
  Administered 2016-10-02: 100 mg via INTRAVENOUS
  Filled 2016-10-02 (×3): qty 100

## 2016-10-02 MED ORDER — LACTATED RINGERS IV SOLN
INTRAVENOUS | Status: DC
  Administered 2016-10-02: 14:00:00 via INTRAVENOUS
  Administered 2016-10-02: 125 mL/h via INTRAVENOUS

## 2016-10-02 MED ORDER — IBUPROFEN 800 MG PO TABS: 800.0000 mg | ORAL_TABLET | Freq: Three times a day (TID) | ORAL | 2 refills | Status: DC | PRN

## 2016-10-02 MED ORDER — ONDANSETRON HCL 4 MG/2ML IJ SOLN
INTRAMUSCULAR | Status: AC
  Filled 2016-10-02: qty 2

## 2016-10-02 MED ORDER — LIDOCAINE HCL (CARDIAC) 20 MG/ML IV SOLN
INTRAVENOUS | Status: DC | PRN
  Administered 2016-10-02: 30 mg via INTRAVENOUS
  Administered 2016-10-02: 100 mg via INTRAVENOUS

## 2016-10-02 MED ORDER — PROPOFOL 10 MG/ML IV BOLUS
INTRAVENOUS | Status: AC
  Filled 2016-10-02: qty 20

## 2016-10-02 MED ORDER — DEXAMETHASONE SODIUM PHOSPHATE 4 MG/ML IJ SOLN
INTRAMUSCULAR | Status: AC
Start: 2016-10-02 — End: 2016-10-02
  Filled 2016-10-02: qty 1

## 2016-10-02 MED ORDER — DOXYCYCLINE HYCLATE 100 MG PO TABS: 100.0000 mg | ORAL_TABLET | Freq: Two times a day (BID) | ORAL | Status: DC

## 2016-10-02 MED ORDER — SCOPOLAMINE 1 MG/3DAYS TD PT72
MEDICATED_PATCH | TRANSDERMAL | Status: AC
  Administered 2016-10-02: 1.5 mg via TRANSDERMAL
  Filled 2016-10-02: qty 1

## 2016-10-02 MED ORDER — ONDANSETRON HCL 4 MG/2ML IJ SOLN
INTRAMUSCULAR | Status: DC | PRN
  Administered 2016-10-02: 4 mg via INTRAVENOUS

## 2016-10-02 MED ORDER — MIDAZOLAM HCL 2 MG/2ML IJ SOLN
INTRAMUSCULAR | Status: DC | PRN
  Administered 2016-10-02: 2 mg via INTRAVENOUS

## 2016-10-02 MED ORDER — METHYLPREDNISOLONE SODIUM SUCC 125 MG IJ SOLR
INTRAMUSCULAR | Status: AC
  Filled 2016-10-02: qty 2

## 2016-10-02 MED ORDER — MIDAZOLAM HCL 2 MG/2ML IJ SOLN
INTRAMUSCULAR | Status: AC
  Filled 2016-10-02: qty 2

## 2016-10-02 MED ORDER — LIDOCAINE HCL (CARDIAC) 20 MG/ML IV SOLN
INTRAVENOUS | Status: AC
  Filled 2016-10-02: qty 5

## 2016-10-02 MED ORDER — FENTANYL CITRATE (PF) 100 MCG/2ML IJ SOLN: 25.0000 ug | INTRAMUSCULAR | Status: DC | PRN

## 2016-10-02 MED ORDER — KETOROLAC TROMETHAMINE 30 MG/ML IJ SOLN
INTRAMUSCULAR | Status: DC | PRN
  Administered 2016-10-02: 30 mg via INTRAVENOUS
  Administered 2016-10-02: 30 mg via INTRAMUSCULAR

## 2016-10-02 SURGICAL SUPPLY — 18 items
CATH ROBINSON RED A/P 16FR (CATHETERS) ×3 IMPLANT
CLOTH BEACON ORANGE TIMEOUT ST (SAFETY) ×3 IMPLANT
DECANTER SPIKE VIAL GLASS SM (MISCELLANEOUS) ×3 IMPLANT
GLOVE BIOGEL PI IND STRL 7.0 (GLOVE) ×2 IMPLANT
GLOVE BIOGEL PI INDICATOR 7.0 (GLOVE) ×4
GLOVE ECLIPSE 6.5 STRL STRAW (GLOVE) ×3 IMPLANT
GOWN STRL REUS W/TWL LRG LVL3 (GOWN DISPOSABLE) ×6 IMPLANT
KIT BERKELEY 1ST TRIMESTER 3/8 (MISCELLANEOUS) ×3 IMPLANT
NS IRRIG 1000ML POUR BTL (IV SOLUTION) ×3 IMPLANT
PACK VAGINAL MINOR WOMEN LF (CUSTOM PROCEDURE TRAY) ×3 IMPLANT
PAD OB MATERNITY 4.3X12.25 (PERSONAL CARE ITEMS) ×3 IMPLANT
PAD PREP 24X48 CUFFED NSTRL (MISCELLANEOUS) ×3 IMPLANT
SET BERKELEY SUCTION TUBING (SUCTIONS) ×3 IMPLANT
TOWEL OR 17X24 6PK STRL BLUE (TOWEL DISPOSABLE) ×6 IMPLANT
VACURETTE 10 RIGID CVD (CANNULA) IMPLANT
VACURETTE 7MM CVD STRL WRAP (CANNULA) ×2 IMPLANT
VACURETTE 8 RIGID CVD (CANNULA) IMPLANT
VACURETTE 9 RIGID CVD (CANNULA) IMPLANT

## 2016-10-02 NOTE — Anesthesia Procedure Notes (Signed)
Procedure Name: LMA Insertion Date/Time: 10/02/2016 1:33 PM Performed by: Elgie CongoMALINOVA, Terris Germano H Pre-anesthesia Checklist: Patient identified, Emergency Drugs available, Suction available and Patient being monitored Patient Re-evaluated:Patient Re-evaluated prior to inductionOxygen Delivery Method: Circle system utilized Preoxygenation: Pre-oxygenation with 100% oxygen Intubation Type: IV induction LMA: LMA inserted LMA Size: 4.0 Number of attempts: 1 Placement Confirmation: positive ETCO2 and breath sounds checked- equal and bilateral Tube secured with: Tape Dental Injury: Teeth and Oropharynx as per pre-operative assessment

## 2016-10-02 NOTE — Anesthesia Preprocedure Evaluation (Signed)

## 2016-10-02 NOTE — H&P (Signed)
Sabrina [name] is an 41 y.o. female. G2P1001 BF @ [redacted] weeks gestation with blighted ovum. Pt started with some vaginal bleeding  Pertinent Gynecological History:  Previous GYN Procedures: c/s  Last mammogram: normal Date:2010 Last pap: normal Date: 2018 OB History: G2, P1   Menstrual History: Menarche age:n/a Patient's last menstrual period was 07/18/2016.    Past Medical History:  Diagnosis Date  . Anemia   . Asthma    ALLERGY INDUCED  . Blood dyscrasia   . Complication of anesthesia    CONFUSION AFTER SURGERY, SLOW TO WAKE UP  . Dysmenorrhea   . High risk HPV infection   . Hyperlipidemia   . VAIN I (vaginal intraepithelial neoplasia grade I)   . Vitamin D deficiency     Past Surgical History:  Procedure Laterality Date  . C02 LASER OF VAIN I  05/22/2006  . CESAREAN SECTION  1997  . LEFT AXILLA FRACTURE  2009   OPEN REDUCTION W/PLATES AND SCREWS  . TONSILLECTOMY  1996  . TONSILLECTOMY      Family History  Problem Relation Age of Onset  . Diabetes Father     INSULIN DEPENDENT  . Hypertension Father   . Heart disease Father   . Cerebral palsy Brother   . Breast cancer Maternal Grandmother     GRANDMOTHER/NO INDICATED IF PAT. OR MATERNAL  . Asthma Brother   . Colon cancer Neg Hx     Social History:  reports that she has never smoked. She has never used smokeless tobacco. She reports that she drinks about 0.6 oz of alcohol per week . She reports that she does not use drugs.  Allergies:  Allergies  Allergen Reactions  . Coconut Oil Other (See Comments)    Itchy throat  . Hydrocodone Itching  . Other     Anesthesia--behavioral changes after waking  . Oxycodone Itching  . Prednisone Swelling    Groggy  . Sulfa Antibiotics Other (See Comments)    Unsure of reaction  . Tramadol Swelling    Swelling on throat    Prescriptions Prior to Admission  Medication Sig Dispense Refill Last Dose  . acetaminophen (TYLENOL) 500 MG tablet Take 500-1,000 mg by mouth  every 6 (six) hours as needed (for pain.).   Past Month at Unknown time  . ibuprofen (ADVIL,MOTRIN) 200 MG tablet Take 600 mg by mouth every 6 (six) hours as needed for cramping.   10/01/2016 at 2100  . Prenatal Vit-Fe Fumarate-FA (PRENATAL VITAMIN PO) Take 1 tablet by mouth at bedtime.    Past Week at Unknown time  . metoCLOPramide (REGLAN) 10 MG tablet Take 1 tablet (10 mg total) by mouth 3 (three) times daily with meals. (Patient not taking: Reported on 10/01/2016) 30 tablet 1 Not Taking at Unknown time    Review of Systems  All other systems reviewed and are negative.   Blood pressure 131/65, pulse 69, temperature 98.3 F (36.8 C), temperature source Oral, resp. rate 16, height 5\' 3"  (1.6 m), weight 82.6 kg (182 lb), last menstrual period 07/18/2016, SpO2 100 %. Physical Exam  Constitutional: She is oriented to person, place, and time. She appears well-developed and well-nourished.  HENT:  Head: Atraumatic.  Eyes: EOM are normal.  Neck: Neck supple.  Cardiovascular: Regular rhythm.   Respiratory: Effort normal.  GI: Soft.  pfannensteil  Genitourinary: Vagina normal.  Musculoskeletal: Normal range of motion.  Neurological: She is alert and oriented to person, place, and time. She has normal reflexes.  Skin:  Skin is warm and dry.  Psychiatric: She has a normal mood and affect.  vulva no lesion Vagina scant blood Cervix closed Uterus sl enlarged No results found for this or any previous visit (from the past 24 hour(s)).  No results found.  Assessment/Plan: Blighted ovum P) suction dilation and evacuation. Risk of surgery includes infection, bleeding, injury to surrounding organ structures, internal scar tissue, uterine perforation and its risk. All ? answered  Michaiah Maiden A 10/02/2016, 1:11 PM

## 2016-10-02 NOTE — Op Note (Signed)
NAME:  Sabrina [name], [name]                 ACCOUNT NO.:  [id number]  MEDICAL RECORD NO.:  [id number]  LOCATION:  PERIO                         FACILITY:  [facility]  PHYSICIAN:  Maxie BetterSheronette Mylea Roarty, M.D.DATE OF BIRTH:  Oct 26, 1975  DATE OF PROCEDURE:  10/02/2016 DATE OF DISCHARGE:                              OPERATIVE REPORT   PREOPERATIVE DIAGNOSIS:  Blighted ovum.  PROCEDURE:  Suction dilation and evacuation.  POSTOPERATIVE DIAGNOSIS:  Blighted ovum.  ANESTHESIA:  General, paracervical block.  SURGEON:  Maxie BetterSheronette Joevanni Roddey, MD.  ASSISTANT:  None.  DESCRIPTION OF PROCEDURE:  Under adequate general anesthesia, the patient was placed in a dorsal lithotomy position.  She was sterilely prepped and draped in the usual fashion.  The bladder was catheterized for moderate amount of urine.  Examination under anesthesia revealed anteverted uterus slightly enlarged.  No adnexal masses could be appreciated.  Bivalve speculum was placed in vagina, 20 mL of 1% Nesacaine was injected paracervically at the 3 o'clock and 9 o'clock position.  The anterior lip of the cervix was grasped with a single- tooth tenaculum.  The cervix was then serially dilated up to #29 [facility] dilator.  A #7 mm curved suction cannula was introduced into the uterine cavity.  Moderate amount of tissue was obtained.  The cavity was then curetted, suctioned until all tissue was felt to be have been removed, at which time, all instruments were then removed from the vagina. Specimen labeled products of conception was sent to Pathology.  ESTIMATED BLOOD LOSS:  5 mL.  COMPLICATIONS:  None.  The patient tolerated the procedure well, was transferred to recovery room in stable condition.     Maxie BetterSheronette Alban Marucci, M.D.     [place]/MEDQ  D:  10/02/2016  T:  10/02/2016  Job:  [id number]

## 2016-10-02 NOTE — Brief Op Note (Signed)
10/02/2016  1:53 PM  PATIENT:  Sabrina [name]  41 y.o. female  PRE-OPERATIVE DIAGNOSIS:  Blighted Ovum  POST-OPERATIVE DIAGNOSIS:  blighted ovum  PROCEDURE:  Suction dilation and evacuation  SURGEON:  Surgeon(s) and Role:    * Maxie BetterSheronette Laureano Hetzer, MD - Primary  PHYSICIAN ASSISTANT:   ASSISTANTS: none   ANESTHESIA:   general and paracervical block Findings: AV uterus , no adnexal mass. Mod amt of POC EBL:  Total I/O In: 700 [I.V.:700] Out: 80 [Urine:75; Blood:5]  BLOOD ADMINISTERED:none  DRAINS: none   LOCAL MEDICATIONS USED:  OTHER nesicaine  SPECIMEN:  Source of Specimen:  POC  DISPOSITION OF SPECIMEN:  PATHOLOGY  COUNTS:  YES  TOURNIQUET:  * No tourniquets in log *  DICTATION: .Other Dictation: Dictation Number [id number]  PLAN OF CARE: Discharge to home after PACU  PATIENT DISPOSITION:  PACU - hemodynamically stable.   Delay start of Pharmacological VTE agent (>24hrs) due to surgical blood loss or risk of bleeding: no

## 2016-10-02 NOTE — Discharge Instructions (Signed)

## 2016-10-02 NOTE — Transfer of Care (Signed)
Immediate Anesthesia Transfer of Care Note  Patient: Sabrina [name]  Procedure(s) Performed: Procedure(s): DILATATION AND EVACUATION (N/A)  Patient Location: PACU  Anesthesia Type:General  Level of Consciousness: awake, alert  and oriented  Airway & Oxygen Therapy: Patient Spontanous Breathing and Patient connected to nasal cannula oxygen  Post-op Assessment: Report given to RN, Post -op Vital signs reviewed and stable and Patient moving all extremities  Post vital signs: Reviewed and stable  Last Vitals:  Vitals:   10/02/16 1217  BP: 131/65  Pulse: 69  Resp: 16  Temp: 36.8 C    Last Pain:  Vitals:   10/02/16 1217  TempSrc: Oral      Patients Stated Pain Goal: 3 (10/02/16 1217)  Complications: No apparent anesthesia complications

## 2016-10-03 NOTE — Anesthesia Postprocedure Evaluation (Signed)
Anesthesia Post Note  Patient: Belia Heman  Procedure(s) Performed: Procedure(s) (LRB): DILATATION AND EVACUATION (N/A)  Patient location during evaluation: PACU Anesthesia Type: MAC Level of consciousness: awake Pain management: satisfactory to patient Vital Signs Assessment: post-procedure vital signs reviewed and stable Respiratory status: spontaneous breathing Cardiovascular status: stable Anesthetic complications: no       Last Vitals:  Vitals:   10/02/16 1500 10/02/16 1515  BP: 124/75 121/79  Pulse: 66 67  Resp: 16 15  Temp:  36.8 C    Last Pain:  Vitals:   10/02/16 1217  TempSrc: Oral                 Kelaiah Escalona EDWARD

## 2016-10-06 ENCOUNTER — Encounter (HOSPITAL_COMMUNITY): Payer: Self-pay | Admitting: Obstetrics and Gynecology

## 2016-12-10 ENCOUNTER — Encounter: Payer: Self-pay | Admitting: Gynecology

## 2017-02-06 NOTE — Addendum Note (Signed)
Addendum  created 02/06/17 1510 by Annie Roseboom, MD   Sign clinical note    

## 2017-02-06 NOTE — Anesthesia Postprocedure Evaluation (Signed)
Anesthesia Post Note  Patient: Sabrina [name]  Procedure(s) Performed: Procedure(s) (LRB): DILATATION AND EVACUATION (N/A)     Anesthesia Post Evaluation  Last Vitals:  Vitals:   10/02/16 1500 10/02/16 1515  BP: 124/75 121/79  Pulse: 66 67  Resp: 16 15  Temp:  36.8 C    Last Pain:  Vitals:   10/03/16 1432  TempSrc:   PainSc: 0-No pain                 Kegan Mckeithan EDWARD

## 2017-07-25 ENCOUNTER — Encounter: Payer: Self-pay | Admitting: Physician Assistant

## 2017-07-25 ENCOUNTER — Other Ambulatory Visit: Payer: Self-pay

## 2017-07-25 ENCOUNTER — Ambulatory Visit: Payer: BC Managed Care – PPO | Admitting: Physician Assistant

## 2017-07-25 VITALS — BP 110/78 | HR 70 | Temp 98.7°F | Resp 18 | Ht 63.0 in | Wt 187.6 lb

## 2017-07-25 DIAGNOSIS — M67432 Ganglion, left wrist: Secondary | ICD-10-CM

## 2017-07-25 DIAGNOSIS — Z23 Encounter for immunization: Secondary | ICD-10-CM | POA: Diagnosis not present

## 2017-07-25 DIAGNOSIS — J040 Acute laryngitis: Secondary | ICD-10-CM | POA: Diagnosis not present

## 2017-07-25 MED ORDER — AZELASTINE HCL 0.1 % NA SOLN: 2.0000 | Freq: Two times a day (BID) | NASAL | 0 refills | Status: DC

## 2017-07-25 NOTE — Progress Notes (Signed)
Patient ID: Sabrina [name], female    DOB: 07/04/1976, 41 y.o.   MRN: [id number]  PCP: Porfirio OarJeffery, Errick Salts, PA-C  Chief Complaint  Patient presents with  . Hoarse    since last friday is the best it has been today   . Mass    on right wrist x a month     Subjective:   Presents for evaluation of laryngitis. She is accompanied by her husband.  Began to lose voice at work last week, and then totally lost it. Has a voice now intermittently, but not normal for her. She is a Runner, broadcasting/film/videoteacher. Unable to sing at church. No fever, chills. Always has a little post-nasal drainage, not more than usual. Only coughing with lying down or getting too hot, and that is normal for her. Maybe a little worse with the laryngitis. Husband coughs with lying down at night, as well. No nausea, vomiting, diarrhea. No ear pain. No sore throat. No HA or dizziness.  Has not taken anything to help alleviate her symptoms, as they are trying to conceive. D&E 09/2016.  Will follow-up with GYN/fertility in January if they are not pregnant.  Ganglion cyst present x 1 month. Non-tender on palpation, but painful with typing and wrist use.    Review of Systems As above.    Patient Active Problem List   Diagnosis Date Noted  . Esophageal reflux 05/02/2015  . Unspecified constipation 03/28/2014  . Family history of diabetes mellitus 08/11/2011  . VAIN I (vaginal intraepithelial neoplasia grade I)   . High risk HPV infection      Prior to Admission medications   Medication Sig Start Date End Date Taking? Authorizing Provider  ibuprofen (ADVIL,MOTRIN) 800 MG tablet Take 1 tablet (800 mg total) by mouth every 8 (eight) hours as needed. Patient not taking: Reported on 07/25/2017 10/02/16   Maxie Betterousins, Sheronette, MD  Prenatal Vit-Fe Fumarate-FA (PRENATAL VITAMIN PO) Take 1 tablet by mouth at bedtime.     [provider]     Allergies  Allergen Reactions  . Coconut Oil Other (See Comments)    Itchy throat    . Hydrocodone Itching  . Other     Anesthesia--behavioral changes after waking  . Oxycodone Itching  . Prednisone Swelling    Groggy  . Sulfa Antibiotics Other (See Comments)    Unsure of reaction  . Tramadol Swelling    Swelling on throat       Objective:  Physical Exam  Constitutional: She is oriented to person, place, and time. She appears well-developed and well-nourished. No distress.  BP 110/78   Pulse 70   Temp 98.7 F (37.1 C) (Oral)   Resp 18   Ht 5\' 3"  (1.6 m)   Wt 187 lb 9.6 oz (85.1 kg)   LMP 07/25/2017   SpO2 99%   Breastfeeding? Unknown Comment: currently on   BMI 33.23 kg/m    HENT:  Head: Normocephalic and atraumatic.  Right Ear: Hearing, tympanic membrane, external ear and ear canal normal.  Left Ear: Hearing, tympanic membrane, external ear and ear canal normal.  Nose: Mucosal edema (pale turbinates) and rhinorrhea present.  No foreign bodies. Right sinus exhibits no maxillary sinus tenderness and no frontal sinus tenderness. Left sinus exhibits no maxillary sinus tenderness and no frontal sinus tenderness.  Mouth/Throat: Uvula is midline, oropharynx is clear and moist and mucous membranes are normal. No uvula swelling. No oropharyngeal exudate.  Eyes: Conjunctivae and EOM are normal. Pupils are equal, round,  and reactive to light. Right eye exhibits no discharge. Left eye exhibits no discharge. No scleral icterus.  Neck: Trachea normal, normal range of motion and full passive range of motion without pain. Neck supple. No thyroid mass and no thyromegaly present.  Cardiovascular: Normal rate, regular rhythm and normal heart sounds.  Pulmonary/Chest: Effort normal and breath sounds normal.  Musculoskeletal:       Arms: Lymphadenopathy:       Head (right side): No submandibular, no tonsillar, no preauricular, no posterior auricular and no occipital adenopathy present.       Head (left side): No submandibular, no tonsillar, no preauricular and no occipital  adenopathy present.    She has no cervical adenopathy.       Right: No supraclavicular adenopathy present.       Left: No supraclavicular adenopathy present.  Neurological: She is alert and oriented to person, place, and time. She has normal strength. No cranial nerve deficit or sensory deficit.  Skin: Skin is warm, dry and intact. No rash noted.  Psychiatric: She has a normal mood and affect. Her speech is normal and behavior is normal.           Assessment & Plan:   1. Laryngitis Supportive care. Anticipatory guidance. - azelastine (ASTELIN) 0.1 % nasal spray; Place 2 sprays into both nostrils 2 (two) times daily. Use in each nostril as directed  Dispense: 30 mL; Refill: 0  2. Need for influenza vaccination - Flu Vaccine QUAD 36+ mos IM  3. Ganglion cyst of wrist, left - Ambulatory referral to Orthopedic Surgery    Return if symptoms worsen or fail to improve.   Fernande Brashelle S. Suhaan Perleberg, PA-C [facility]

## 2017-07-25 NOTE — Patient Instructions (Addendum)
   IF you received an x-ray today, you will receive an invoice from [facility]. Please contact [facility] at [phone] with questions or concerns regarding your invoice.   IF you received labwork today, you will receive an invoice from [facility]. Please contact [facility] at [phone] with questions or concerns regarding your invoice.   Our billing staff will not be able to assist you with questions regarding bills from these companies.  You will be contacted with the lab results as soon as they are available. The fastest way to get your results is to activate your My Chart account. Instructions are located on the last page of this paperwork. If you have not heard from us regarding the results in 2 weeks, please contact this office.      Laryngitis Laryngitis is inflammation of your vocal cords. This causes hoarseness, coughing, loss of voice, sore throat, or a dry throat. Your vocal cords are two bands of muscles that are found in your throat. When you speak, these cords come together and vibrate. These vibrations come out through your mouth as sound. When your vocal cords are inflamed, your voice sounds different. Laryngitis can be temporary (acute) or long-term (chronic). Most cases of acute laryngitis improve with time. Chronic laryngitis is laryngitis that lasts for more than three weeks. What are the causes? Acute laryngitis may be caused by:  A viral infection.  Lots of talking, yelling, or singing. This is also called vocal strain.  Bacterial infections.  Chronic laryngitis may be caused by:  Vocal strain.  Injury to your vocal cords.  Acid reflux (gastroesophageal reflux disease or GERD).  Allergies.  Sinus infection.  Smoking.  Alcohol abuse.  Breathing in chemicals or dust.  Growths on the vocal cords.  What increases the risk? Risk factors for laryngitis include:  Smoking.  Alcohol abuse.  Having allergies.  What are the  signs or symptoms? Symptoms of laryngitis may include:  Low, hoarse voice.  Loss of voice.  Dry cough.  Sore throat.  Stuffy nose.  How is this diagnosed? Laryngitis may be diagnosed by:  Physical exam.  Throat culture.  Blood test.  Laryngoscopy. This procedure allows your health care provider to look at your vocal cords with a mirror or viewing tube.  How is this treated? Treatment for laryngitis depends on what is causing it. Usually, treatment involves resting your voice and using medicines to soothe your throat. However, if your laryngitis is caused by a bacterial infection, you may need to take antibiotic medicine. If your laryngitis is caused by a growth, you may need to have a procedure to remove it. Follow these instructions at home:  Drink enough fluid to keep your urine clear or pale yellow.  Breathe in moist air. Use a humidifier if you live in a dry climate.  Take medicines only as directed by your health care provider.  If you were prescribed an antibiotic medicine, finish it all even if you start to feel better.  Do not smoke cigarettes or electronic cigarettes. If you need help quitting, ask your health care provider.  Talk as little as possible. Also avoid whispering, which can cause vocal strain.  Write instead of talking. Do this until your voice is back to normal. Contact a health care provider if:  You have a fever.  You have increasing pain.  You have difficulty swallowing. Get help right away if:  You cough up blood.  You have trouble breathing. This information is not intended   to replace advice given to you by your health care provider. Make sure you discuss any questions you have with your health care provider. Document Released: 07/14/2005 Document Revised: 12/20/2015 Document Reviewed: 12/27/2013 Elsevier Interactive Patient Education  2018 [employer] Inc[employer]  

## 2017-09-04 ENCOUNTER — Encounter: Payer: Self-pay | Admitting: Physician Assistant

## 2017-09-04 ENCOUNTER — Other Ambulatory Visit: Payer: Self-pay

## 2017-09-04 ENCOUNTER — Ambulatory Visit: Payer: BC Managed Care – PPO | Admitting: Physician Assistant

## 2017-09-04 VITALS — BP 124/80 | HR 88 | Temp 98.9°F | Resp 18 | Ht 63.0 in | Wt 185.8 lb

## 2017-09-04 DIAGNOSIS — Z114 Encounter for screening for human immunodeficiency virus [HIV]: Secondary | ICD-10-CM

## 2017-09-04 DIAGNOSIS — K30 Functional dyspepsia: Secondary | ICD-10-CM

## 2017-09-04 DIAGNOSIS — R109 Unspecified abdominal pain: Secondary | ICD-10-CM

## 2017-09-04 DIAGNOSIS — Z23 Encounter for immunization: Secondary | ICD-10-CM | POA: Diagnosis not present

## 2017-09-04 DIAGNOSIS — N643 Galactorrhea not associated with childbirth: Secondary | ICD-10-CM

## 2017-09-04 LAB — POCT URINALYSIS DIP (MANUAL ENTRY)
Bilirubin, UA: NEGATIVE
Glucose, UA: NEGATIVE mg/dL
Ketones, POC UA: NEGATIVE mg/dL
Leukocytes, UA: NEGATIVE
Nitrite, UA: NEGATIVE
Protein Ur, POC: NEGATIVE mg/dL
Spec Grav, UA: 1.01 (ref 1.010–1.025)
Urobilinogen, UA: 0.2 E.U./dL
pH, UA: 6.5 (ref 5.0–8.0)

## 2017-09-04 LAB — POCT URINE PREGNANCY: Preg Test, Ur: NEGATIVE

## 2017-09-04 MED ORDER — RANITIDINE HCL 150 MG PO TABS: 150.0000 mg | ORAL_TABLET | Freq: Two times a day (BID) | ORAL | 0 refills | Status: DC

## 2017-09-04 NOTE — Progress Notes (Signed)
Patient ID: Sabrina [name], female    DOB: 1975-12-24, 42 y.o.   MRN: [id number]  PCP: Porfirio OarJeffery, Chenel Wernli, PA-C  Chief Complaint  Patient presents with  . Hormonal Changes    x1 week, pt states she experiencing few symptoms. Pt states she has some lower back pain and abdominal tightness. Pt states she is experienced some white discharge from the right breast. Pt states she can smell everything.  Marland Kitchen. Heartburn    Pt states she has been experiencing some nausea and some burning when she eats. Pt reports no vomiting.     Subjective:   Presents for evaluation of multiple complaints. She is accompanied by her husband, [name].  She thinks that she is having hormonal changes. She felt hot on 08/31/2017. For the past week, she has had low back pain, abdominal tightness, nausea and burning epigastric pain with eating, Sensation of a lump in the throat, heightened sense of smell, and one day of whitish discharge from the RIGHT breast.  Denies increased breast stimulation.  They are trying to conceive.  LMP 08/21/2017. Initially, she said it was normal, then related that it was more painful than usual. Bleeding was heavy initially, and lasted 7 days.  She relates a history of having "weird" symptoms prior to the onset of her menses: back pain and headache, heartburn (though not as bad as recently). Has never experienced nipple discharge, abdominal tightness, increased sensitivity to smells. Cycles are 28-30 days and regular.  She is usually constipated, but isn't right now.a    Review of Systems As above. No CP, SOB, dizziness. No urinary urgency, frequency, burning.     Patient Active Problem List   Diagnosis Date Noted  . Esophageal reflux 05/02/2015  . Unspecified constipation 03/28/2014  . Family history of diabetes mellitus 08/11/2011  . VAIN I (vaginal intraepithelial neoplasia grade I)   . High risk HPV infection      Prior to Admission medications   Medication Sig  Start Date End Date Taking? Authorizing Provider  Prenatal Vit-Fe Fumarate-FA (PRENATAL VITAMIN PO) Take 1 tablet by mouth at bedtime.    Yes [provider]  azelastine (ASTELIN) 0.1 % nasal spray Place 2 sprays into both nostrils 2 (two) times daily. Use in each nostril as directed Patient not taking: Reported on 09/04/2017 07/25/17   Porfirio OarJeffery, Billie Trager, PA-C  ibuprofen (ADVIL,MOTRIN) 800 MG tablet Take 1 tablet (800 mg total) by mouth every 8 (eight) hours as needed. Patient not taking: Reported on 07/25/2017 10/02/16   Maxie Betterousins, Sheronette, MD     Allergies  Allergen Reactions  . Coconut Oil Other (See Comments)    Itchy throat  . Hydrocodone Itching  . Other     Anesthesia--behavioral changes after waking  . Oxycodone Itching  . Prednisone Swelling    Groggy  . Sulfa Antibiotics Other (See Comments)    Unsure of reaction  . Tramadol Swelling    Swelling on throat       Objective:  Physical Exam  Constitutional: She is oriented to person, place, and time. She appears well-developed and well-nourished. She is active and cooperative. No distress.  BP 124/80 (BP Location: Left Arm, Patient Position: Sitting, Cuff Size: Large)   Pulse 88   Temp 98.9 F (37.2 C) (Oral)   Resp 18   Ht 5\' 3"  (1.6 m)   Wt 185 lb 12.8 oz (84.3 kg)   LMP 08/21/2017   SpO2 99%   Breastfeeding? No   BMI 32.91  kg/m   HENT:  Head: Normocephalic and atraumatic.  Right Ear: Hearing normal.  Left Ear: Hearing normal.  Eyes: Conjunctivae are normal. No scleral icterus.  Neck: Normal range of motion. Neck supple. No thyromegaly present.  Cardiovascular: Normal rate, regular rhythm and normal heart sounds.  Pulses:      Radial pulses are 2+ on the right side, and 2+ on the left side.  Pulmonary/Chest: Effort normal and breath sounds normal.  Abdominal: Soft. Bowel sounds are normal.  Lymphadenopathy:       Head (right side): No tonsillar, no preauricular, no posterior auricular and no occipital  adenopathy present.       Head (left side): No tonsillar, no preauricular, no posterior auricular and no occipital adenopathy present.    She has no cervical adenopathy.       Right: No supraclavicular adenopathy present.       Left: No supraclavicular adenopathy present.  Neurological: She is alert and oriented to person, place, and time. No sensory deficit.  Skin: Skin is warm, dry and intact. No rash noted. No cyanosis or erythema. Nails show no clubbing.  Psychiatric: She has a normal mood and affect. Her speech is normal and behavior is normal.       Results for orders placed or performed in visit on 09/04/17  POCT urinalysis dipstick  Result Value Ref Range   Color, UA yellow yellow   Clarity, UA clear clear   Glucose, UA negative negative mg/dL   Bilirubin, UA negative negative   Ketones, POC UA negative negative mg/dL   Spec Grav, UA 0.981 1.914 - 1.025   Blood, UA trace-lysed (A) negative   pH, UA 6.5 5.0 - 8.0   Protein Ur, POC negative negative mg/dL   Urobilinogen, UA 0.2 0.2 or 1.0 E.U./dL   Nitrite, UA Negative Negative   Leukocytes, UA Negative Negative  POCT urine pregnancy  Result Value Ref Range   Preg Test, Ur Negative Negative       Assessment & Plan:   1. Abdominal pain, unspecified abdominal location Unclear etiology. Likely constipation or mild viral enteritis. - POCT urinalysis dipstick - POCT urine pregnancy - CBC with Differential/Platelet - Comprehensive metabolic panel  2. Galactorrhea Await labs.  - Beta HCG, Quant - Prolactin - TSH  3. Need for Tdap vaccination - Tdap vaccine greater than or equal to 7yo IM  4. Screening for HIV (human immunodeficiency virus) - HIV antibody  5. Indigestion Await labs. Treat with H2 blocker. Avoid foods that trigger the symptoms.  - ranitidine (ZANTAC) 150 MG tablet; Take 1 tablet (150 mg total) by mouth 2 (two) times daily.  Dispense: 60 tablet; Refill: 0    Return if symptoms worsen or fail  to improve.   Fernande Bras, PA-C [facility] at [facility]

## 2017-09-04 NOTE — Patient Instructions (Addendum)
Things that often make reflux symptoms worse: Caffeine Carbonation (soda) Spicy foods Acidic foods (like tomato sauce, orange juice, lemonade) Fatty foods (including whole milk and ice cream) Stress (feeling sad, worried, nervous) Nicotine Alcohol NSAIDS (non-steroidal anti-inflammatories, like ibuprofen (Advil, Motrin) or naproxen (Aleve)).     IF you received an x-ray today, you will receive an invoice from [facility]. Please contact [facility] at [phone] with questions or concerns regarding your invoice.   IF you received labwork today, you will receive an invoice from [facility]. Please contact [facility] at [phone] with questions or concerns regarding your invoice.   Our billing staff will not be able to assist you with questions regarding bills from these companies.  You will be contacted with the lab results as soon as they are available. The fastest way to get your results is to activate your My Chart account. Instructions are located on the last page of this paperwork. If you have not heard from us regarding the results in 2 weeks, please contact this office.      

## 2017-09-05 LAB — CBC WITH DIFFERENTIAL/PLATELET
Basophils Absolute: 0 10*3/uL (ref 0.0–0.2)
Basos: 0 %
EOS (ABSOLUTE): 0.1 10*3/uL (ref 0.0–0.4)
Eos: 1 %
Hematocrit: 37.9 % (ref 34.0–46.6)
Hemoglobin: 12.7 g/dL (ref 11.1–15.9)
Immature Grans (Abs): 0 10*3/uL (ref 0.0–0.1)
Immature Granulocytes: 0 %
Lymphocytes Absolute: 1.9 10*3/uL (ref 0.7–3.1)
Lymphs: 36 %
MCH: 33.2 pg — ABNORMAL HIGH (ref 26.6–33.0)
MCHC: 33.5 g/dL (ref 31.5–35.7)
MCV: 99 fL — ABNORMAL HIGH (ref 79–97)
Monocytes Absolute: 0.4 10*3/uL (ref 0.1–0.9)
Monocytes: 8 %
Neutrophils Absolute: 2.8 10*3/uL (ref 1.4–7.0)
Neutrophils: 55 %
Platelets: 271 10*3/uL (ref 150–379)
RBC: 3.83 x10E6/uL (ref 3.77–5.28)
RDW: 13.3 % (ref 12.3–15.4)
WBC: 5.2 10*3/uL (ref 3.4–10.8)

## 2017-09-05 LAB — COMPREHENSIVE METABOLIC PANEL
ALT: 18 IU/L (ref 0–32)
AST: 22 IU/L (ref 0–40)
Albumin/Globulin Ratio: 1.8 (ref 1.2–2.2)
Albumin: 4.9 g/dL (ref 3.5–5.5)
Alkaline Phosphatase: 54 IU/L (ref 39–117)
BUN/Creatinine Ratio: 7 — ABNORMAL LOW (ref 9–23)
BUN: 6 mg/dL (ref 6–24)
Bilirubin Total: 0.7 mg/dL (ref 0.0–1.2)
CO2: 25 mmol/L (ref 20–29)
Calcium: 9.6 mg/dL (ref 8.7–10.2)
Chloride: 100 mmol/L (ref 96–106)
Creatinine, Ser: 0.88 mg/dL (ref 0.57–1.00)
GFR calc Af Amer: 94 mL/min/{1.73_m2} (ref >59)
GFR calc non Af Amer: 82 mL/min/{1.73_m2} (ref >59)
Globulin, Total: 2.8 g/dL (ref 1.5–4.5)
Glucose: 82 mg/dL (ref 65–99)
Potassium: 4 mmol/L (ref 3.5–5.2)
Sodium: 140 mmol/L (ref 134–144)
Total Protein: 7.7 g/dL (ref 6.0–8.5)

## 2017-09-05 LAB — TSH: TSH: 1.81 u[IU]/mL (ref 0.450–4.500)

## 2017-09-05 LAB — PROLACTIN: Prolactin: 30.9 ng/mL — ABNORMAL HIGH (ref 4.8–23.3)

## 2017-09-05 LAB — HIV ANTIBODY (ROUTINE TESTING W REFLEX): HIV Screen 4th Generation wRfx: NONREACTIVE

## 2017-09-05 LAB — BETA HCG QUANT (REF LAB): hCG Quant: <1 m[IU]/mL

## 2017-09-28 ENCOUNTER — Other Ambulatory Visit: Payer: Self-pay

## 2017-09-28 ENCOUNTER — Encounter (HOSPITAL_BASED_OUTPATIENT_CLINIC_OR_DEPARTMENT_OTHER): Payer: Self-pay | Admitting: *Deleted

## 2017-09-28 ENCOUNTER — Emergency Department (HOSPITAL_BASED_OUTPATIENT_CLINIC_OR_DEPARTMENT_OTHER): Payer: BC Managed Care – PPO

## 2017-09-28 ENCOUNTER — Emergency Department (HOSPITAL_BASED_OUTPATIENT_CLINIC_OR_DEPARTMENT_OTHER)
Admission: EM | Admit: 2017-09-28 | Discharge: 2017-09-28 | Disposition: A | Discharge status: Home / Self Care | Payer: BC Managed Care – PPO | Attending: Emergency Medicine | Admitting: Emergency Medicine

## 2017-09-28 DIAGNOSIS — R079 Chest pain, unspecified: Secondary | ICD-10-CM | POA: Diagnosis present

## 2017-09-28 DIAGNOSIS — J45909 Unspecified asthma, uncomplicated: Secondary | ICD-10-CM | POA: Insufficient documentation

## 2017-09-28 DIAGNOSIS — R072 Precordial pain: Secondary | ICD-10-CM | POA: Insufficient documentation

## 2017-09-28 LAB — CBC WITH DIFFERENTIAL/PLATELET
Basophils Absolute: 0 10*3/uL (ref 0.0–0.1)
Basophils Relative: 0 %
Eosinophils Absolute: 0.1 10*3/uL (ref 0.0–0.7)
Eosinophils Relative: 2 %
HCT: 36.4 % (ref 36.0–46.0)
Hemoglobin: 12.1 g/dL (ref 12.0–15.0)
Lymphocytes Relative: 48 %
Lymphs Abs: 1.8 10*3/uL (ref 0.7–4.0)
MCH: 32.9 pg (ref 26.0–34.0)
MCHC: 33.2 g/dL (ref 30.0–36.0)
MCV: 98.9 fL (ref 78.0–100.0)
Monocytes Absolute: 0.4 10*3/uL (ref 0.1–1.0)
Monocytes Relative: 10 %
Neutro Abs: 1.5 10*3/uL — ABNORMAL LOW (ref 1.7–7.7)
Neutrophils Relative %: 40 %
Platelets: 206 10*3/uL (ref 150–400)
RBC: 3.68 MIL/uL — ABNORMAL LOW (ref 3.87–5.11)
RDW: 12.4 % (ref 11.5–15.5)
WBC: 3.7 10*3/uL — ABNORMAL LOW (ref 4.0–10.5)

## 2017-09-28 LAB — D-DIMER, QUANTITATIVE (NOT AT ARMC): D-Dimer, Quant: 0.43 ug/mL-FEU (ref 0.00–0.50)

## 2017-09-28 LAB — COMPREHENSIVE METABOLIC PANEL
ALT: 17 U/L (ref 14–54)
AST: 21 U/L (ref 15–41)
Albumin: 4.3 g/dL (ref 3.5–5.0)
Alkaline Phosphatase: 44 U/L (ref 38–126)
Anion gap: 8 (ref 5–15)
BUN: 8 mg/dL (ref 6–20)
CO2: 26 mmol/L (ref 22–32)
Calcium: 8.9 mg/dL (ref 8.9–10.3)
Chloride: 104 mmol/L (ref 101–111)
Creatinine, Ser: 0.83 mg/dL (ref 0.44–1.00)
GFR calc Af Amer: >60 mL/min (ref >60)
GFR calc non Af Amer: >60 mL/min (ref >60)
Glucose, Bld: 101 mg/dL — ABNORMAL HIGH (ref 65–99)
Potassium: 3.8 mmol/L (ref 3.5–5.1)
Sodium: 138 mmol/L (ref 135–145)
Total Bilirubin: 1 mg/dL (ref 0.3–1.2)
Total Protein: 7.3 g/dL (ref 6.5–8.1)

## 2017-09-28 LAB — TROPONIN I
Troponin I: <0.03 ng/mL (ref <0.03)
Troponin I: <0.03 ng/mL (ref <0.03)

## 2017-09-28 LAB — PREGNANCY, URINE: Preg Test, Ur: NEGATIVE

## 2017-09-28 MED ORDER — IBUPROFEN 800 MG PO TABS: 800.0000 mg | ORAL_TABLET | Freq: Three times a day (TID) | ORAL | 0 refills | Status: DC | PRN

## 2017-09-28 MED ORDER — KETOROLAC TROMETHAMINE 30 MG/ML IJ SOLN
30.0000 mg | Freq: Once | INTRAMUSCULAR | Status: AC
  Administered 2017-09-28: 30 mg via INTRAVENOUS
  Filled 2017-09-28: qty 1

## 2017-09-28 MED ORDER — SODIUM CHLORIDE 0.9 % IV BOLUS (SEPSIS)
500.0000 mL | Freq: Once | INTRAVENOUS | Status: AC
  Administered 2017-09-28: 500 mL via INTRAVENOUS

## 2017-09-28 NOTE — Discharge Instructions (Signed)

## 2017-09-28 NOTE — ED Triage Notes (Signed)
Pt states she was sitting on the side of the bed getting ready for work when she developed sharp midsternal CP. No radiation. Denies other s/s. Tearful and tachypneic at triage. Pain increases with movement. Taken to ED3. Placed on monitor showing NSR with occ PVC. EKG being done.

## 2017-09-28 NOTE — ED Provider Notes (Signed)
Emergency Department Provider Note   I have reviewed the triage vital signs and the nursing notes.   HISTORY  Chief Complaint Chest Pain   HPI Sabrina [name] is a 42 y.o. female with PMH of HLD and family history CAD at 16 presents to the ED for evaluation of sudden onset, sharp, substernal chest pain that began while the patient was getting ready for work.  No history of similar pain in the past.  She feels like it slightly right-sided and in the back.  It is worse with movement and deep breathing.  Denies any exertional component to pain.  No diaphoresis.  No radiation to the arms, jaw, or abdomen.  The pain is not associated with eating.  Patient does have a family history of coronary artery disease with her father having a heart attack at 45 and dying at 55 of this disease.  Patient has never smoked.  She is not on oral contraception pills.   Past Medical History:  Diagnosis Date  . Anemia   . Asthma    ALLERGY INDUCED  . Blood dyscrasia   . Complication of anesthesia    CONFUSION AFTER SURGERY, SLOW TO WAKE UP  . Dysmenorrhea   . High risk HPV infection   . Hyperlipidemia   . VAIN I (vaginal intraepithelial neoplasia grade I)   . Vitamin D deficiency     Patient Active Problem List   Diagnosis Date Noted  . Esophageal reflux 05/02/2015  . Unspecified constipation 03/28/2014  . Family history of diabetes mellitus 08/11/2011  . VAIN I (vaginal intraepithelial neoplasia grade I)   . High risk HPV infection     Past Surgical History:  Procedure Laterality Date  . C02 LASER OF VAIN I  05/22/2006  . CESAREAN SECTION  1997  . DILATION AND EVACUATION N/A 10/02/2016   Procedure: DILATATION AND EVACUATION;  Surgeon: Maxie Better, MD;  Location: [facility];  Service: Gynecology;  Laterality: N/A;  . LEFT AXILLA FRACTURE  2009   OPEN REDUCTION W/PLATES AND SCREWS  . TONSILLECTOMY  1996  . TONSILLECTOMY      Current Outpatient Rx  . Order #: [id number] Class: Print  .  Order #: [id number] Class: Historical Med  . Order #: [id number] Class: Normal    Allergies Coconut oil; Hydrocodone; Other; Oxycodone; Prednisone; Sulfa antibiotics; and Tramadol  Family History  Problem Relation Age of Onset  . Diabetes Father        INSULIN DEPENDENT  . Hypertension Father   . Heart disease Father   . Cerebral palsy Brother   . Asthma Brother   . Breast cancer Maternal Grandmother        GRANDMOTHER/NO INDICATED IF PAT. OR MATERNAL  . Colon cancer Neg Hx     Social History Social History   Tobacco Use  . Smoking status: Never Smoker  . Smokeless tobacco: Never Used  Substance Use Topics  . Alcohol use: Yes    Alcohol/week: 0.6 oz    Types: 1 Standard drinks or equivalent per week    Comment: occ...  . Drug use: No    Review of Systems  Constitutional: No fever/chills Eyes: No visual changes. ENT: No sore throat. Cardiovascular: Positive chest pain. Respiratory: Denies shortness of breath. Gastrointestinal: No abdominal pain.  No nausea, no vomiting.  No diarrhea.  No constipation. Genitourinary: Negative for dysuria. Musculoskeletal: Negative for back pain. Skin: Negative for rash. Neurological: Negative for headaches, focal weakness or numbness.  10-point ROS otherwise negative.  ____________________________________________  PHYSICAL EXAM:  VITAL SIGNS: ED Triage Vitals  Enc Vitals Group     BP 09/28/17 0720 135/83     Pulse Rate 09/28/17 0720 60     Resp 09/28/17 0720 20     Temp 09/28/17 0720 97.8 F (36.6 C)     Temp Source 09/28/17 0720 Oral     SpO2 09/28/17 0720 100 %     Pain Score 09/28/17 0721 1   Constitutional: Alert and oriented. Well appearing and in no acute distress. Eyes: Conjunctivae are normal.  Head: Atraumatic. Nose: No congestion/rhinnorhea. Mouth/Throat: Mucous membranes are moist.  Oropharynx non-erythematous. Neck: No stridor.  Cardiovascular: Normal rate, regular rhythm. Good peripheral circulation.  Grossly normal heart sounds.   Respiratory: Normal respiratory effort.  No retractions. Lungs CTAB. Gastrointestinal: Soft and nontender. No distention.  Musculoskeletal: No lower extremity tenderness nor edema. No gross deformities of extremities. Neurologic:  Normal speech and language. No gross focal neurologic deficits are appreciated.  Skin:  Skin is warm, dry and intact. No rash noted.   ____________________________________________   LABS (all labs ordered are listed, but only abnormal results are displayed)  Labs Reviewed  COMPREHENSIVE METABOLIC PANEL - Abnormal; Notable for the following components:      Result Value   Glucose, Bld 101 (*)    All other components within normal limits  CBC WITH DIFFERENTIAL/PLATELET - Abnormal; Notable for the following components:   WBC 3.7 (*)    RBC 3.68 (*)    Neutro Abs 1.5 (*)    All other components within normal limits  D-DIMER, QUANTITATIVE (NOT AT [facility])  TROPONIN I  PREGNANCY, URINE  TROPONIN I   ____________________________________________  EKG   EKG Interpretation  Date/Time:  Monday September 28 2017 07:10:16 EST Ventricular Rate:  69 PR Interval:    QRS Duration: 82 QT Interval:  408 QTC Calculation: 438 R Axis:   43 Text Interpretation:  Sinus rhythm Borderline T abnormalities, inferior leads No STEMI.  Confirmed by Alona BeneLong, Elanor Cale [phone] on 09/28/2017 7:15:16 AM       ____________________________________________  RADIOLOGY  Dg Chest 2 View  Result Date: 09/28/2017 CLINICAL DATA:  Chest pain for several hours EXAM: CHEST  2 VIEW COMPARISON:  08/01/2008 FINDINGS: The heart size and mediastinal contours are within normal limits. Both lungs are clear. The visualized skeletal structures are unremarkable. IMPRESSION: No active cardiopulmonary disease. Electronically Signed   By: Alcide CleverMark  Lukens M.D.   On: 09/28/2017 07:45    ____________________________________________   PROCEDURES  Procedure(s) performed:    Procedures  None  ____________________________________________   INITIAL IMPRESSION / ASSESSMENT AND PLAN / ED COURSE  Pertinent labs & imaging results that were available during my care of the patient were reviewed by me and considered in my medical decision making (see chart for details).  Patient presents to the emergency department for evaluation of acute onset substernal chest pain that is sharp and radiates slightly to the right back.  Somewhat pleuritic and worse with movement.  Patient is low risk for PE by [place].  My suspicion for PE remains slightly elevated and so will obtain a d-dimer for further risk stratification.  The patient's heart score is 1.  EKG reviewed above.   Differential includes all life-threatening causes for chest pain. This includes but is not exclusive to acute coronary syndrome, aortic dissection, pulmonary embolism, cardiac tamponade, community-acquired pneumonia, pericarditis, musculoskeletal chest wall pain, etc.  Initial troponin and CXR reviewed. No acute findings. D-dimer negative. Remaining labs reviewed and  normal. Patient biomarkers were trended and remained normal. Plan for PCP and Cardiology follow up.   At this time, I do not feel there is any life-threatening condition present. I have reviewed and discussed all results (EKG, imaging, lab, urine as appropriate), exam findings with patient. I have reviewed nursing notes and appropriate previous records.  I feel the patient is safe to be discharged home without further emergent workup. Discussed usual and customary return precautions. Patient and family (if present) verbalize understanding and are comfortable with this plan.  Patient will follow-up with their primary care provider. If they do not have a primary care provider, information for follow-up has been provided to them. All questions have been answered.  ____________________________________________  FINAL CLINICAL IMPRESSION(S) / ED  DIAGNOSES  Final diagnoses:  Precordial chest pain     MEDICATIONS GIVEN DURING THIS VISIT:  Medications  sodium chloride 0.9 % bolus 500 mL (0 mLs Intravenous Stopped 09/28/17 0831)  ketorolac (TORADOL) 30 MG/ML injection 30 mg (30 mg Intravenous Given 09/28/17 0829)     NEW OUTPATIENT MEDICATIONS STARTED DURING THIS VISIT:  Discharge Medication List as of 09/28/2017 11:05 AM    START taking these medications   Details  ibuprofen (ADVIL,MOTRIN) 800 MG tablet Take 1 tablet (800 mg total) by mouth every 8 (eight) hours as needed., Starting Mon 09/28/2017, Print        Note:  This document was prepared using Dragon voice recognition software and may include unintentional dictation errors.  Alona Bene, MD Emergency Medicine    Inella Kuwahara, Arlyss Repress, MD 09/28/17 [phone]

## 2017-09-30 ENCOUNTER — Other Ambulatory Visit: Payer: Self-pay | Admitting: Physician Assistant

## 2017-09-30 DIAGNOSIS — R7989 Other specified abnormal findings of blood chemistry: Secondary | ICD-10-CM

## 2017-09-30 DIAGNOSIS — N643 Galactorrhea not associated with childbirth: Secondary | ICD-10-CM

## 2017-09-30 DIAGNOSIS — E229 Hyperfunction of pituitary gland, unspecified: Secondary | ICD-10-CM

## 2017-10-03 ENCOUNTER — Other Ambulatory Visit: Payer: Self-pay | Admitting: Physician Assistant

## 2017-10-03 DIAGNOSIS — K30 Functional dyspepsia: Secondary | ICD-10-CM

## 2017-10-29 ENCOUNTER — Encounter: Payer: Self-pay | Admitting: Physician Assistant

## 2017-11-09 ENCOUNTER — Ambulatory Visit (INDEPENDENT_AMBULATORY_CARE_PROVIDER_SITE_OTHER): Payer: BC Managed Care – PPO

## 2017-11-09 ENCOUNTER — Other Ambulatory Visit: Payer: Self-pay

## 2017-11-09 ENCOUNTER — Ambulatory Visit: Payer: BC Managed Care – PPO | Admitting: Physician Assistant

## 2017-11-09 ENCOUNTER — Encounter: Payer: Self-pay | Admitting: Physician Assistant

## 2017-11-09 VITALS — BP 118/72 | HR 78 | Temp 98.0°F | Resp 16 | Ht 64.57 in | Wt 193.0 lb

## 2017-11-09 DIAGNOSIS — M25561 Pain in right knee: Secondary | ICD-10-CM

## 2017-11-09 NOTE — Progress Notes (Signed)
11/11/2017 8:35 AM   DOB: 01-29-1976 / MRN: [id number]  SUBJECTIVE:  Sabrina HewsDebra D. [name] is a 42 y.o. female presenting for right-sided knee pain that has been present now for about 1 week.  The patient is trying to conceive and thus is trying not to take NSAIDs.  Ambulation makes the pain worse while rest makes the pain better.  Is not tried compression as of yet.  She is allergic to coconut oil; hydrocodone; other; oxycodone; prednisone; sulfa antibiotics; and tramadol.   She  has a past medical history of Anemia, Asthma, Complication of anesthesia, Dysmenorrhea, High risk HPV infection, Hyperlipidemia, VAIN I (vaginal intraepithelial neoplasia grade I), and Vitamin D deficiency.    She  reports that she has never smoked. She has never used smokeless tobacco. She reports that she drinks about 0.6 oz of alcohol per week. She reports that she does not use drugs. She  reports that she currently engages in sexual activity and has had partners who are Female. She reports using the following methods of birth control/protection:  and None. The patient  has a past surgical history that includes Cesarean section (1997); C02 LASER OF VAIN I (05/22/2006); Tonsillectomy (1996); LEFT AXILLA FRACTURE (2009); Tonsillectomy; and Dilation and evacuation (N/A, 10/02/2016).  Her family history includes Asthma in her brother; Breast cancer in her maternal grandmother; Cerebral palsy in her brother; Diabetes in her father; Heart disease in her father; Hypertension in her father.  ROS  The problem list and medications were reviewed and updated by myself where necessary and exist elsewhere in the encounter.   OBJECTIVE:  BP 118/72   Pulse 78   Temp 98 F (36.7 C) (Oral)   Resp 16   Ht 5' 4.57" (1.64 m)   Wt 193 lb (87.5 kg)   SpO2 98%   BMI 32.55 kg/m   Physical Exam  Musculoskeletal: She exhibits no edema, tenderness or deformity.  Right knee negative for ligamentous laxity.  Negative for effusion, bruising,  warmth.  Negative McMurray's.   Dg Knee Complete 4 Views Right  Result Date: 11/09/2017 CLINICAL DATA:  Acute right knee pain. No reported injury. History of joint infection. EXAM: RIGHT KNEE - COMPLETE 4+ VIEW COMPARISON:  None. FINDINGS: No fracture, joint effusion or dislocation. No suspicious focal osseous lesion. Tiny marginal osteophytes in lateral patellofemoral compartment without significant joint space narrowing. Medial and lateral compartments appear preserved. No radiopaque foreign body. 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 Electronically Signed   By: Delbert PhenixJason A Poff M.D.   On: 11/09/2017 18:01    ASSESSMENT AND PLAN:  Stanton KidneyDebra was seen today for knee pain.  Diagnoses and all orders for this visit:  Acute pain of right knee: She has excellent physical exam.  I suspect her symptoms will improve with Tylenol and compression.  I provided an Ace wrap today and taught her how to wrap the leg.  Radiology calling minimal osteoarthritis in the lateral patellofemoral compartment of the right knee.  Patient does not want to take medication chronically and is trying to conceive.  If her pain does not abate with compression and Tylenol she may benefit from physical therapy. -     DG Knee Complete 4 Views Right; Future    The patient is advised to call or return to clinic if she does not see an improvement in symptoms, or to seek the care of the closest emergency department if she worsens with the above plan.  [name], MHS, PA-C Primary [facility] at [facility] 11/11/2017 8:35 AM

## 2017-11-09 NOTE — Patient Instructions (Addendum)
  The knee looks great on radiograph.  We need to give this some time. Wear the ace wrap.  Take 1000 mg of tylenol every 8 hours.    IF you received an x-ray today, you will receive an invoice from [facility]. Please contact [facility] at [phone] with questions or concerns regarding your invoice.   IF you received labwork today, you will receive an invoice from [facility]. Please contact [facility] at [phone] with questions or concerns regarding your invoice.   Our billing staff will not be able to assist you with questions regarding bills from these companies.  You will be contacted with the lab results as soon as they are available. The fastest way to get your results is to activate your My Chart account. Instructions are located on the last page of this paperwork. If you have not heard from us regarding the results in 2 weeks, please contact this office.

## 2018-01-09 ENCOUNTER — Encounter: Payer: Self-pay | Admitting: Physician Assistant

## 2018-01-09 DIAGNOSIS — R921 Mammographic calcification found on diagnostic imaging of breast: Secondary | ICD-10-CM | POA: Insufficient documentation

## 2018-01-09 DIAGNOSIS — N6001 Solitary cyst of right breast: Secondary | ICD-10-CM | POA: Insufficient documentation

## 2018-02-11 ENCOUNTER — Encounter: Payer: Self-pay | Admitting: *Deleted

## 2018-02-25 ENCOUNTER — Encounter: Payer: Self-pay | Admitting: *Deleted

## 2018-02-25 NOTE — Progress Notes (Signed)
prol 15.30 09/22/2017

## 2020-01-23 IMAGING — CR DG CHEST 2V
2 series · 2 of 2 positions shown · non-contrast
Comparison: 08/01/2008

CLINICAL DATA: Chest pain for several hours

EXAM:
CHEST  2 VIEW

[w chest pa]
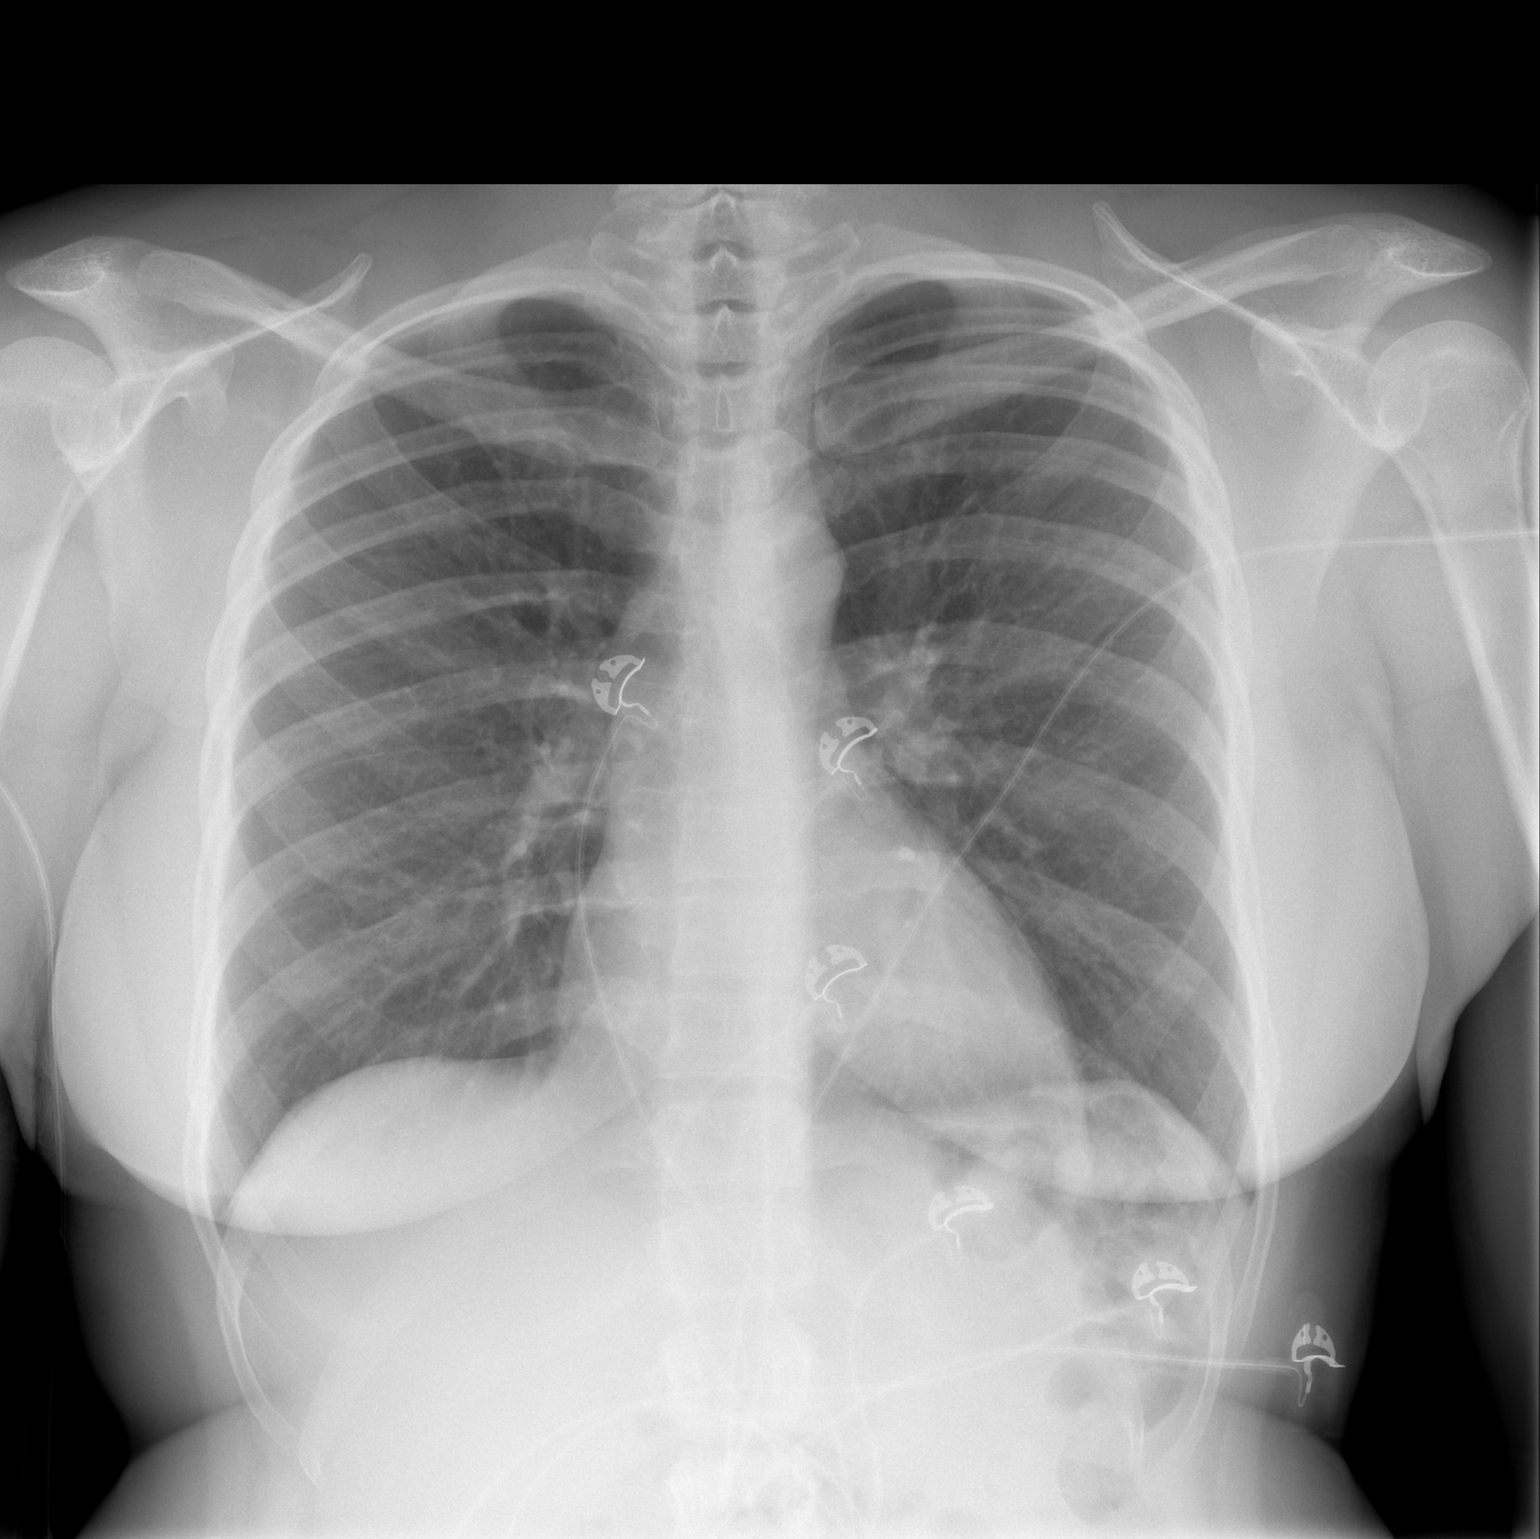

[w chest lat]
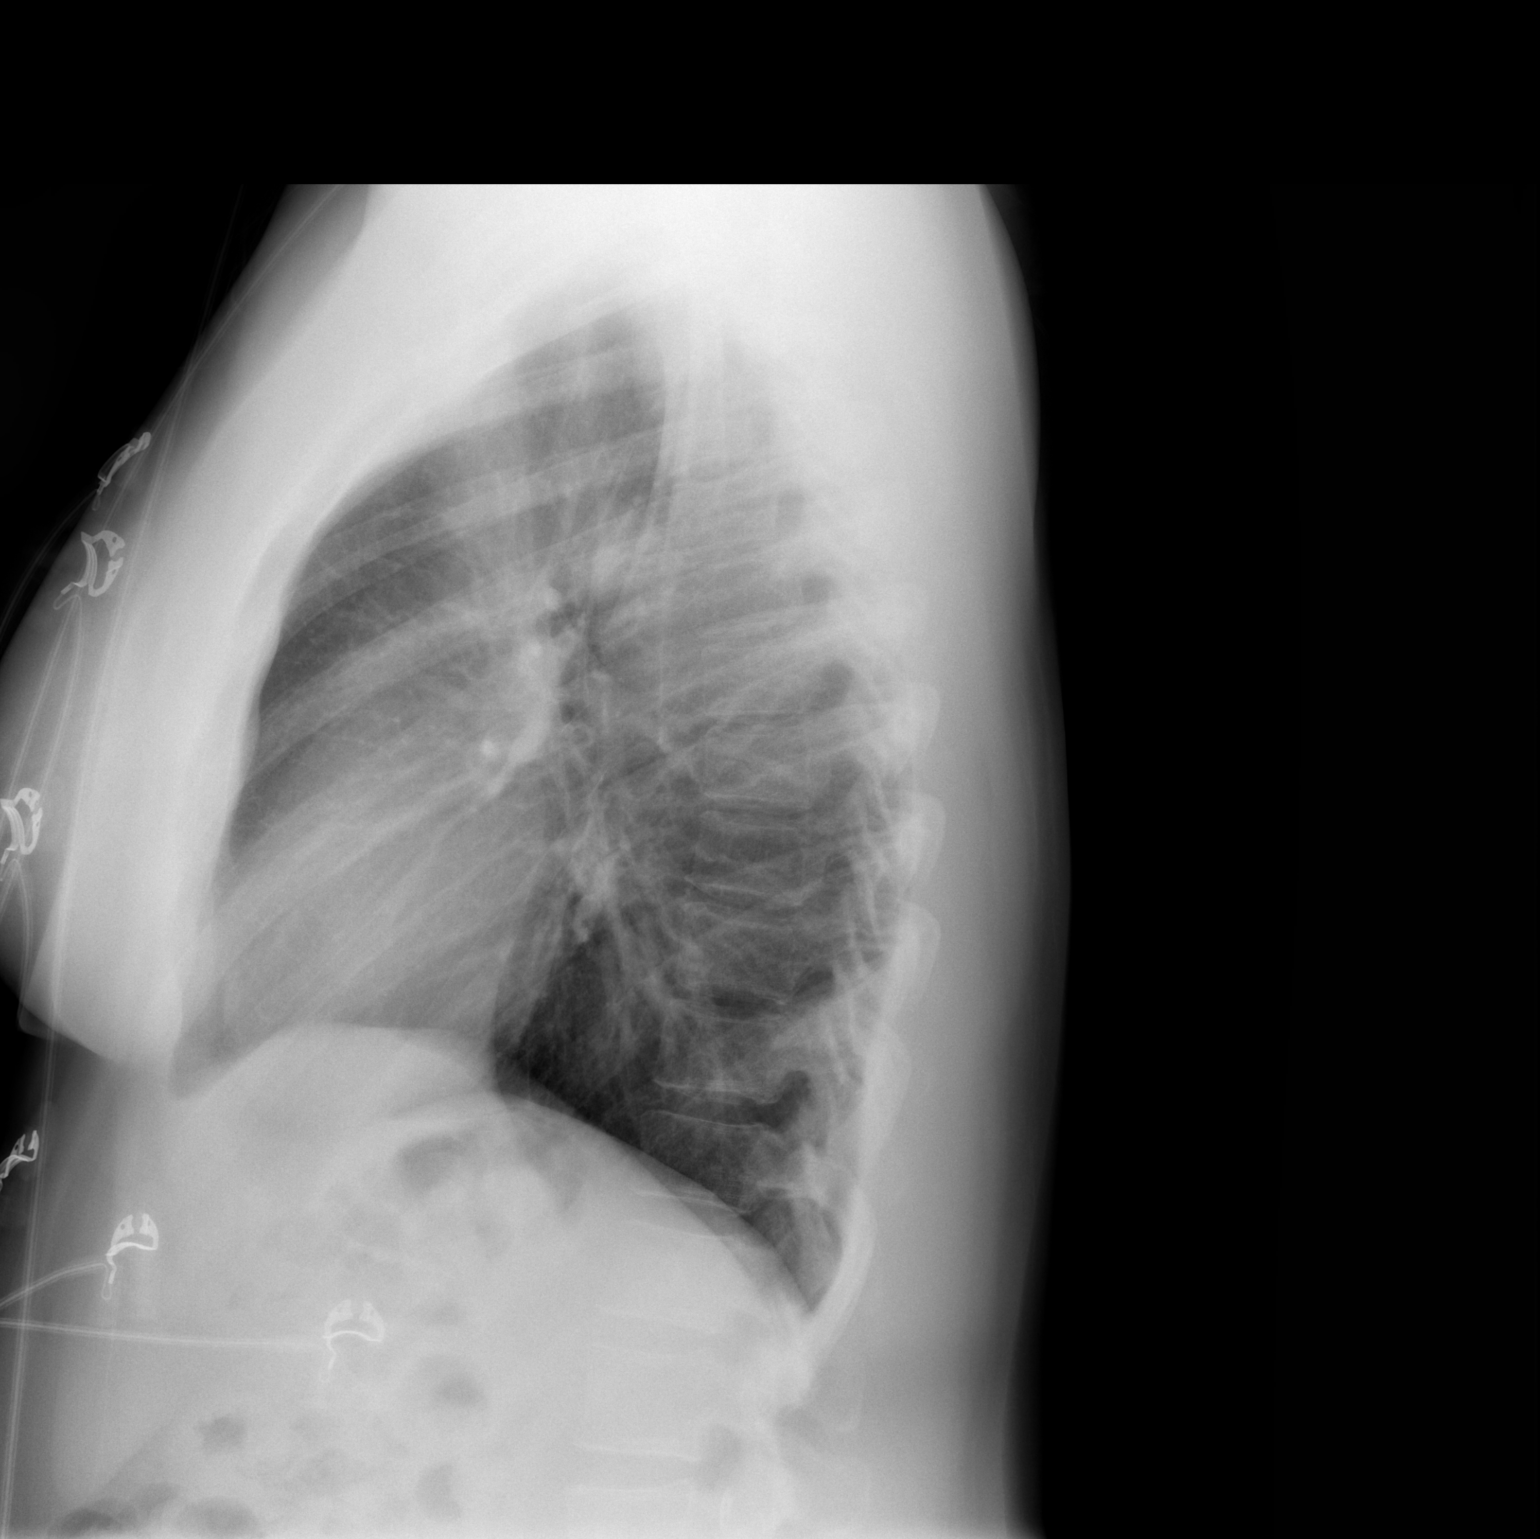

[2 of 2 positions shown; findings below may reference images not displayed]

FINDINGS: The heart size and mediastinal contours are within normal limits.
Both lungs are clear. The visualized skeletal structures are
unremarkable.
IMPRESSION: No active cardiopulmonary disease.

## 2020-07-09 ENCOUNTER — Other Ambulatory Visit: Payer: Self-pay

## 2021-05-31 LAB — HM PAP SMEAR: HPV, high-risk: NEGATIVE

## 2021-08-08 ENCOUNTER — Other Ambulatory Visit: Payer: Self-pay

## 2021-08-21 ENCOUNTER — Other Ambulatory Visit: Payer: Self-pay

## 2021-09-02 ENCOUNTER — Other Ambulatory Visit: Payer: Self-pay | Admitting: Diagnostic Radiology

## 2021-09-02 DIAGNOSIS — N6459 Other signs and symptoms in breast: Secondary | ICD-10-CM

## 2021-09-17 ENCOUNTER — Ambulatory Visit
Admission: RE | Admit: 2021-09-17 | Discharge: 2021-09-17 | Disposition: A | Discharge status: Home / Self Care | Payer: BC Managed Care – PPO | Source: Ambulatory Visit | Attending: Diagnostic Radiology | Admitting: Diagnostic Radiology

## 2021-09-17 DIAGNOSIS — N6459 Other signs and symptoms in breast: Secondary | ICD-10-CM

## 2021-09-17 MED ORDER — GADOBUTROL 1 MMOL/ML IV SOLN
9.0000 mL | Freq: Once | INTRAVENOUS | Status: AC | PRN
  Administered 2021-09-17: 9 mL via INTRAVENOUS

## 2021-09-20 ENCOUNTER — Other Ambulatory Visit: Payer: Self-pay | Admitting: Diagnostic Radiology

## 2021-09-20 DIAGNOSIS — R9389 Abnormal findings on diagnostic imaging of other specified body structures: Secondary | ICD-10-CM

## 2021-09-25 ENCOUNTER — Ambulatory Visit
Admission: RE | Admit: 2021-09-25 | Discharge: 2021-09-25 | Disposition: A | Discharge status: Home / Self Care | Payer: BC Managed Care – PPO | Source: Ambulatory Visit | Attending: Diagnostic Radiology | Admitting: Diagnostic Radiology

## 2021-09-25 DIAGNOSIS — R9389 Abnormal findings on diagnostic imaging of other specified body structures: Secondary | ICD-10-CM

## 2021-09-25 MED ORDER — GADOBUTROL 1 MMOL/ML IV SOLN
9.0000 mL | Freq: Once | INTRAVENOUS | Status: AC | PRN
  Administered 2021-09-25: 9 mL via INTRAVENOUS

## 2022-06-01 ENCOUNTER — Encounter: Payer: Self-pay | Admitting: Internal Medicine

## 2022-06-01 NOTE — Progress Notes (Unsigned)
      Subjective:    Patient ID: Sabrina [name], female    DOB: 09-Aug-1975, 46 y.o.   MRN: [id number]     HPI Sabrina [name]  is here to establish with a new pcp.   Sabrina [name] is here for follow up of her chronic medical problems, including     Medications and allergies reviewed with patient and updated if appropriate.  Current Outpatient Medications on File Prior to Visit  Medication Sig Dispense Refill   ibuprofen (ADVIL,MOTRIN) 800 MG tablet Take 1 tablet (800 mg total) by mouth every 8 (eight) hours as needed. 21 tablet 0   Prenatal Vit-Fe Fumarate-FA (PRENATAL VITAMIN PO) Take 1 tablet by mouth at bedtime.      ranitidine (ZANTAC) 150 MG tablet TAKE 1 TABLET BY MOUTH TWICE A DAY 60 tablet 0   No current facility-administered medications on file prior to visit.     Review of Systems     Objective:  There were no vitals filed for this visit. BP Readings from Last 3 Encounters:  11/09/17 118/72  09/28/17 113/75  09/04/17 124/80   Wt Readings from Last 3 Encounters:  11/09/17 193 lb (87.5 kg)  09/28/17 186 lb (84.4 kg)  09/04/17 185 lb 12.8 oz (84.3 kg)   There is no height or weight on file to calculate BMI.    Physical Exam     Lab Results  Component Value Date   WBC 3.7 (L) 09/28/2017   HGB 12.1 09/28/2017   HCT 36.4 09/28/2017   PLT 206 09/28/2017   GLUCOSE 101 (H) 09/28/2017   CHOL 187 06/12/2016   TRIG 72 06/12/2016   HDL 61 06/12/2016   LDLCALC 112 (H) 06/12/2016   ALT 17 09/28/2017   AST 21 09/28/2017   NA 138 09/28/2017   K 3.8 09/28/2017   CL 104 09/28/2017   CREATININE 0.83 09/28/2017   BUN 8 09/28/2017   CO2 26 09/28/2017   TSH 1.810 09/04/2017   HGBA1C 5.3 01/02/2015     Assessment & Plan:    See Problem List for Assessment and Plan of chronic medical problems.

## 2022-06-02 ENCOUNTER — Ambulatory Visit: Payer: BC Managed Care – PPO | Admitting: Internal Medicine

## 2022-06-02 ENCOUNTER — Encounter: Payer: Self-pay | Admitting: Internal Medicine

## 2022-06-02 VITALS — BP 122/78 | HR 83 | Temp 99.0°F | Ht 64.5 in | Wt 185.0 lb

## 2022-06-02 DIAGNOSIS — Z833 Family history of diabetes mellitus: Secondary | ICD-10-CM | POA: Diagnosis not present

## 2022-06-02 DIAGNOSIS — Z0000 Encounter for general adult medical examination without abnormal findings: Secondary | ICD-10-CM | POA: Diagnosis not present

## 2022-06-02 LAB — COMPREHENSIVE METABOLIC PANEL
ALT: 14 U/L (ref 0–35)
AST: 22 U/L (ref 0–37)
Albumin: 4.6 g/dL (ref 3.5–5.2)
Alkaline Phosphatase: 43 U/L (ref 39–117)
BUN: 9 mg/dL (ref 6–23)
CO2: 29 mEq/L (ref 19–32)
Calcium: 9.6 mg/dL (ref 8.4–10.5)
Chloride: 103 mEq/L (ref 96–112)
Creatinine, Ser: 0.81 mg/dL (ref 0.40–1.20)
GFR: 87.24 mL/min (ref >60.00)
Glucose, Bld: 83 mg/dL (ref 70–99)
Potassium: 3.8 mEq/L (ref 3.5–5.1)
Sodium: 139 mEq/L (ref 135–145)
Total Bilirubin: 0.7 mg/dL (ref 0.2–1.2)
Total Protein: 7.4 g/dL (ref 6.0–8.3)

## 2022-06-02 LAB — CBC WITH DIFFERENTIAL/PLATELET
Basophils Absolute: 0 10*3/uL (ref 0.0–0.1)
Basophils Relative: 0.6 % (ref 0.0–3.0)
Eosinophils Absolute: 0.1 10*3/uL (ref 0.0–0.7)
Eosinophils Relative: 1.3 % (ref 0.0–5.0)
HCT: 37.1 % (ref 36.0–46.0)
Hemoglobin: 12.2 g/dL (ref 12.0–15.0)
Lymphocytes Relative: 42.7 % (ref 12.0–46.0)
Lymphs Abs: 1.9 10*3/uL (ref 0.7–4.0)
MCHC: 33 g/dL (ref 30.0–36.0)
MCV: 99.6 fl (ref 78.0–100.0)
Monocytes Absolute: 0.4 10*3/uL (ref 0.1–1.0)
Monocytes Relative: 8.4 % (ref 3.0–12.0)
Neutro Abs: 2.1 10*3/uL (ref 1.4–7.7)
Neutrophils Relative %: 47 % (ref 43.0–77.0)
Platelets: 256 10*3/uL (ref 150.0–400.0)
RBC: 3.72 Mil/uL — ABNORMAL LOW (ref 3.87–5.11)
RDW: 12.9 % (ref 11.5–15.5)
WBC: 4.4 10*3/uL (ref 4.0–10.5)

## 2022-06-02 LAB — LIPID PANEL
Cholesterol: 178 mg/dL (ref 0–200)
HDL: 53.4 mg/dL (ref >39.00)
LDL Cholesterol: 109 mg/dL — ABNORMAL HIGH (ref 0–99)
NonHDL: 124.25
Total CHOL/HDL Ratio: 3
Triglycerides: 76 mg/dL (ref 0.0–149.0)
VLDL: 15.2 mg/dL (ref 0.0–40.0)

## 2022-06-02 LAB — TSH: TSH: 1.47 u[IU]/mL (ref 0.35–5.50)

## 2022-06-02 LAB — HEMOGLOBIN A1C: Hgb A1c MFr Bld: 5.3 % (ref 4.6–6.5)

## 2022-06-02 NOTE — Patient Instructions (Addendum)
It was nice to meet you.   Blood work was ordered.   The lab is on the first floor.    Medications changes include :   none    Return in about 1 year (around 06/03/2023) for Physical Exam.     Health Maintenance, Female Adopting a healthy lifestyle and getting preventive care are important in promoting health and wellness. Ask your health care provider about: The right schedule for you to have regular tests and exams. Things you can do on your own to prevent diseases and keep yourself healthy. What should I know about diet, weight, and exercise? Eat a healthy diet  Eat a diet that includes plenty of vegetables, fruits, low-fat dairy products, and lean protein. Do not eat a lot of foods that are high in solid fats, added sugars, or sodium. Maintain a healthy weight Body mass index (BMI) is used to identify weight problems. It estimates body fat based on height and weight. Your health care provider can help determine your BMI and help you achieve or maintain a healthy weight. Get regular exercise Get regular exercise. This is one of the most important things you can do for your health. Most adults should: Exercise for at least 150 minutes each week. The exercise should increase your heart rate and make you sweat (moderate-intensity exercise). Do strengthening exercises at least twice a week. This is in addition to the moderate-intensity exercise. Spend less time sitting. Even light physical activity can be beneficial. Watch cholesterol and blood lipids Have your blood tested for lipids and cholesterol at 46 years of age, then have this test every 5 years. Have your cholesterol levels checked more often if: Your lipid or cholesterol levels are high. You are older than 46 years of age. You are at high risk for heart disease. What should I know about cancer screening? Depending on your health history and family history, you may need to have cancer screening at various ages.  This may include screening for: Breast cancer. Cervical cancer. Colorectal cancer. Skin cancer. Lung cancer. What should I know about heart disease, diabetes, and high blood pressure? Blood pressure and heart disease High blood pressure causes heart disease and increases the risk of stroke. This is more likely to develop in people who have high blood pressure readings or are overweight. Have your blood pressure checked: Every 3-5 years if you are 37-38 years of age. Every year if you are 75 years old or older. Diabetes Have regular diabetes screenings. This checks your fasting blood sugar level. Have the screening done: Once every three years after age 85 if you are at a normal weight and have a low risk for diabetes. More often and at a younger age if you are overweight or have a high risk for diabetes. What should I know about preventing infection? Hepatitis B If you have a higher risk for hepatitis B, you should be screened for this virus. Talk with your health care provider to find out if you are at risk for hepatitis B infection. Hepatitis C Testing is recommended for: Everyone born from [birth year] through 1965. Anyone with known risk factors for hepatitis C. Sexually transmitted infections (STIs) Get screened for STIs, including gonorrhea and chlamydia, if: You are sexually active and are younger than 46 years of age. You are older than 46 years of age and your health care provider tells you that you are at risk for this type of infection. Your sexual activity has changed since you  were last screened, and you are at increased risk for chlamydia or gonorrhea. Ask your health care provider if you are at risk. Ask your health care provider about whether you are at high risk for HIV. Your health care provider may recommend a prescription medicine to help prevent HIV infection. If you choose to take medicine to prevent HIV, you should first get tested for HIV. You should then be tested every 3  months for as long as you are taking the medicine. Pregnancy If you are about to stop having your period (premenopausal) and you may become pregnant, seek counseling before you get pregnant. Take 400 to 800 micrograms (mcg) of folic acid every day if you become pregnant. Ask for birth control (contraception) if you want to prevent pregnancy. Osteoporosis and menopause Osteoporosis is a disease in which the bones lose minerals and strength with aging. This can result in bone fractures. If you are 77 years old or older, or if you are at risk for osteoporosis and fractures, ask your health care provider if you should: Be screened for bone loss. Take a calcium or vitamin D supplement to lower your risk of fractures. Be given hormone replacement therapy (HRT) to treat symptoms of menopause. Follow these instructions at home: Alcohol use Do not drink alcohol if: Your health care provider tells you not to drink. You are pregnant, may be pregnant, or are planning to become pregnant. If you drink alcohol: Limit how much you have to: 0-1 drink a day. Know how much alcohol is in your drink. In the [place] one drink equals one 12 oz bottle of beer (355 mL), one 5 oz glass of wine (148 mL), or one 1 oz glass of hard liquor (44 mL). Lifestyle Do not use any products that contain nicotine or tobacco. These products include cigarettes, chewing tobacco, and vaping devices, such as e-cigarettes. If you need help quitting, ask your health care provider. Do not use street drugs. Do not share needles. Ask your health care provider for help if you need support or information about quitting drugs. General instructions Schedule regular health, dental, and eye exams. Stay current with your vaccines. Tell your health care provider if: You often feel depressed. You have ever been abused or do not feel safe at home. Summary Adopting a healthy lifestyle and getting preventive care are important in promoting health  and wellness. Follow your health care provider's instructions about healthy diet, exercising, and getting tested or screened for diseases. Follow your health care provider's instructions on monitoring your cholesterol and blood pressure. This information is not intended to replace advice given to you by your health care provider. Make sure you discuss any questions you have with your health care provider. Document Revised: 12/03/2020 Document Reviewed: 12/03/2020 Elsevier Patient Education  [employer].

## 2022-06-02 NOTE — Assessment & Plan Note (Signed)
Chronic Check a1c Low sugar / carb diet Stressed regular exercise  

## 2022-06-05 LAB — HM PAP SMEAR: HPV, high-risk: NEGATIVE

## 2022-09-01 ENCOUNTER — Other Ambulatory Visit: Payer: Self-pay | Admitting: Diagnostic Radiology

## 2022-09-01 DIAGNOSIS — N6459 Other signs and symptoms in breast: Secondary | ICD-10-CM

## 2023-02-20 ENCOUNTER — Encounter: Payer: Self-pay | Admitting: Internal Medicine

## 2023-02-20 MED ORDER — SCOPOLAMINE 1 MG/3DAYS TD PT72: 1.0000 | MEDICATED_PATCH | TRANSDERMAL | 0 refills | Status: DC

## 2023-08-27 ENCOUNTER — Encounter: Payer: Self-pay | Admitting: Internal Medicine

## 2023-08-31 ENCOUNTER — Ambulatory Visit: Payer: 59 | Admitting: Internal Medicine

## 2023-08-31 ENCOUNTER — Encounter: Payer: Self-pay | Admitting: Internal Medicine

## 2023-08-31 VITALS — BP 124/70 | HR 68 | Temp 98.7°F | Resp 18 | Ht 64.5 in | Wt 188.0 lb

## 2023-08-31 DIAGNOSIS — J4521 Mild intermittent asthma with (acute) exacerbation: Secondary | ICD-10-CM | POA: Insufficient documentation

## 2023-08-31 DIAGNOSIS — J069 Acute upper respiratory infection, unspecified: Secondary | ICD-10-CM | POA: Diagnosis not present

## 2023-08-31 DIAGNOSIS — J45909 Unspecified asthma, uncomplicated: Secondary | ICD-10-CM | POA: Insufficient documentation

## 2023-08-31 MED ORDER — ALBUTEROL SULFATE HFA 108 (90 BASE) MCG/ACT IN AERS: 2.0000 | INHALATION_SPRAY | Freq: Four times a day (QID) | RESPIRATORY_TRACT | 5 refills | Status: AC | PRN

## 2023-08-31 NOTE — Progress Notes (Signed)
    Subjective:    Patient ID: Sabrina [name], female    DOB: March 04, 1976, 48 y.o.   MRN: [id number]      HPI Sabrina [name] is here for  Chief Complaint  Patient presents with   Nasal Congestion    Symptoms started last Sunday (husband had flu); started feeling congestion last Monday. Feels like she has congestion in her chest now and it gets difficult to breath; Head, teeth are sore (sinus infection)    Recently husband got sick with the flu.  10 days ago -her symptoms started and she thought memoir her typical allergies.  She started on the planus medication and then switched to NyQuil/DayQuil.  She is not taken those in a few days.  Some of her cold symptoms have improved, but she was concerned because of the shortness of breath she has been experiencing and tightness in her chest.  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  Her sinus pain and congestion have improved.  She has not noted any fevers, coughing or wheezing.    Medications and allergies reviewed with patient and updated if appropriate.  No current outpatient medications on file prior to visit.   No current facility-administered medications on file prior to visit.    Review of Systems  Constitutional:  Negative for fever.  HENT:  Positive for congestion, postnasal drip (resolved) and sinus pain. Negative for ear pain (clogged) and sore throat.        Teeth pain  Respiratory:  Positive for chest tightness and shortness of breath. Negative for cough and wheezing.   Neurological:  Positive for headaches.       Objective:   Vitals:   08/31/23 1626  BP: 124/70  Pulse: 68  Resp: 18  Temp: 98.7 F (37.1 C)   BP Readings from Last 3 Encounters:  08/31/23 124/70  06/02/22 122/78  11/09/17 118/72   Wt Readings from Last 3 Encounters:  08/31/23 188 lb (85.3 kg)  06/02/22 185 lb (83.9 kg)  11/09/17 193 lb (87.5 kg)   Body mass index is 31.77 kg/m.    Physical Exam Constitutional:      General:  She is not in acute distress.    Appearance: Normal appearance. She is not ill-appearing.  HENT:     Head: Normocephalic and atraumatic.     Right Ear: Tympanic membrane, ear canal and external ear normal.     Left Ear: Tympanic membrane, ear canal and external ear normal.     Mouth/Throat:     Mouth: Mucous membranes are moist.     Pharynx: No oropharyngeal exudate or posterior oropharyngeal erythema.  Eyes:     Conjunctiva/sclera: Conjunctivae normal.  Cardiovascular:     Rate and Rhythm: Normal rate and regular rhythm.  Pulmonary:     Effort: Pulmonary effort is normal. No respiratory distress.     Breath sounds: Normal breath sounds. No wheezing or rales.  Musculoskeletal:     Cervical back: Neck supple. No tenderness.  Lymphadenopathy:     Cervical: No cervical adenopathy.  Skin:    General: Skin is warm and dry.  Neurological:     Mental Status: She is alert.            Assessment & Plan:    See Problem List for Assessment and Plan of chronic medical problems.

## 2023-08-31 NOTE — Patient Instructions (Addendum)
       Medications changes include :   albuterol inhaler - use every 6 hrs as needed     Return if symptoms worsen or fail to improve.

## 2023-08-31 NOTE — Assessment & Plan Note (Signed)
Acute Likely exacerbation of asthma secondary to URI URI sounds like it has improved so no antibiotic necessary Start albuterol inhaler every 6 hours as needed-recommend taking regularly for the next couple of days and then trying to decrease Does not tolerate prednisone so hopefully albuterol be enough If there is no improvement she will let me know so we can consider additional treatment

## 2023-09-08 ENCOUNTER — Encounter: Payer: Self-pay | Admitting: Internal Medicine

## 2023-12-16 NOTE — Progress Notes (Signed)
 Subjective:    Patient ID: Sabrina [name], female    DOB: [birth year], 48 y.o.   MRN: [id number]      HPI Sabrina [name] is here for  Chief Complaint  Patient presents with   Abdominal Pain    Abdominal pain, bloating, nausea, cramping x 1 week; Feels full when she isn't; gasy feeling noted (anytime of day) No diarrhea    Abdominal pain for more than 1-2 weeks.  The pain is intermittent.  She has cramping, gas and bloating. The cramping is in the LLQ or on the b/l sides of her abdomen.  After she eats she gets bloating immediately.  She feels full quickly and will feel nauseous.  She stays constipated but goes daily.  She has been diagnosed with IBS-C.  She takes probiotics and ativia but not daily b/c it caused yeast infections.  Stool can be hard or soft.  Not having complete BMs.    Fiber supp made it worse in the past.  Does take magnesium citrate intermittently to help liters of bowel.  Has taken stool softeners in the past and it did not work.  Has tried smooth move tea in the past and it did not work.  Not drinking as much water the past 2 weeks.  Very active during day   Medications and allergies reviewed with patient and updated if appropriate.  Current Outpatient Medications on File Prior to Visit  Medication Sig Dispense Refill   albuterol  (VENTOLIN  HFA) 108 (90 Base) MCG/ACT inhaler Inhale 2 puffs into the lungs every 6 (six) hours as needed for wheezing or shortness of breath. 8 g 5   No current facility-administered medications on file prior to visit.    Review of Systems  Constitutional:  Negative for appetite change, chills and fever.  Gastrointestinal:  Positive for abdominal distention, abdominal pain, constipation and nausea. Negative for blood in stool (no black stool), diarrhea and vomiting.       Gerd more freuqent  Genitourinary:  Negative for dysuria, frequency, hematuria and urgency.  Neurological:  Negative for light-headedness and headaches.        Objective:   Vitals:   12/17/23 0753  BP: 124/78  Pulse: 68  Resp: 18  Temp: 98.8 F (37.1 C)   BP Readings from Last 3 Encounters:  12/17/23 124/78  08/31/23 124/70  06/02/22 122/78   Wt Readings from Last 3 Encounters:  12/17/23 190 lb (86.2 kg)  08/31/23 188 lb (85.3 kg)  06/02/22 185 lb (83.9 kg)   Body mass index is 32.11 kg/m.    Physical Exam Constitutional:      General: She is not in acute distress.    Appearance: Normal appearance. She is well-developed. She is not ill-appearing.  HENT:     Head: Normocephalic and atraumatic.  Abdominal:     General: There is no distension.     Palpations: Abdomen is soft. There is no mass.     Tenderness: There is abdominal tenderness (Mild diffuse tenderness primarily located in the left lower quadrant). There is no guarding or rebound.     Hernia: No hernia is present.  Skin:    General: Skin is warm and dry.     Findings: No rash.  Neurological:     Mental Status: She is alert.            Assessment & Plan:    See Problem List for Assessment and Plan of chronic medical problems.

## 2023-12-17 ENCOUNTER — Ambulatory Visit (INDEPENDENT_AMBULATORY_CARE_PROVIDER_SITE_OTHER): Admitting: Internal Medicine

## 2023-12-17 VITALS — BP 124/78 | HR 68 | Temp 98.8°F | Resp 18 | Ht 64.5 in | Wt 190.0 lb

## 2023-12-17 DIAGNOSIS — K5909 Other constipation: Secondary | ICD-10-CM

## 2023-12-17 NOTE — Assessment & Plan Note (Signed)
 Acute on chronic Has had chronic constipation for years and was diagnosed with IBS-seen in the past Has tried several things over the years She is trying to get pregnant so is concerned about what she can and cannot take Symptoms consistent with probable constipation despite having daily bowel movements-they are not complete Discussed several things that she can try Advised taking MiraLAX once-twice daily to clean herself out-can take daily if she wishes, but could also start magnesium glycinate supplement, try stool softener, probiotic, dried fruit, retry smooth move tea-discussed it may be a combination of some of these things that will keep her constipation well-controlled Discussed safe medications to take just in case she was to get pregnant If her symptoms or not improving she will let me know

## 2023-12-17 NOTE — Patient Instructions (Addendum)
    Docusate 1-3 pills a day or senokot Probiotics miralax - which you can take daily if needed Prunes, prune juice, dates or raisons Magnesium glycinate supplement over the counter     Medications changes include :   None    Return if symptoms worsen or fail to improve.    Constipation, Adult Constipation is when a person has fewer than three bowel movements in a week, has difficulty having a bowel movement, or has stools (feces) that are dry, hard, or larger than normal. Constipation may be caused by an underlying condition. It may become worse with age if a person takes certain medicines and does not take in enough fluids. Follow these instructions at home: Eating and drinking  Eat foods that have a lot of fiber, such as beans, whole grains, and fresh fruits and vegetables. Limit foods that are low in fiber and high in fat and processed sugars, such as fried or sweet foods. These include french fries, hamburgers, cookies, candies, and soda. Drink enough fluid to keep your urine pale yellow. General instructions Exercise regularly or as told by your health care provider. Try to do 150 minutes of moderate exercise each week. Use the bathroom when you have the urge to go. Do not hold it in. Take over-the-counter and prescription medicines only as told by your health care provider. This includes any fiber supplements. During bowel movements: Practice deep breathing while relaxing the lower abdomen. Practice pelvic floor relaxation. Watch your condition for any changes. Let your health care provider know about them. Keep all follow-up visits as told by your health care provider. This is important. Contact a health care provider if: You have pain that gets worse. You have a fever. You do not have a bowel movement after 4 days. You vomit. You are not hungry or you lose weight. You are bleeding from the opening between the buttocks (anus). You have thin, pencil-like stools. Get  help right away if: You have a fever and your symptoms suddenly get worse. You leak stool or have blood in your stool. Your abdomen is bloated. You have severe pain in your abdomen. You feel dizzy or you faint. Summary Constipation is when a person has fewer than three bowel movements in a week, has difficulty having a bowel movement, or has stools (feces) that are dry, hard, or larger than normal. Eat foods that have a lot of fiber, such as beans, whole grains, and fresh fruits and vegetables. Drink enough fluid to keep your urine pale yellow. Take over-the-counter and prescription medicines only as told by your health care provider. This includes any fiber supplements. This information is not intended to replace advice given to you by your health care provider. Make sure you discuss any questions you have with your health care provider. Document Revised: 05/28/2022 Document Reviewed: 05/28/2022 Elsevier Patient Education  2024 [employer].

## 2024-01-03 ENCOUNTER — Encounter: Payer: Self-pay | Admitting: Internal Medicine
